# Patient Record
Sex: Male | Born: 1950
Health system: Southern US, Community
[De-identification: ages and names within clinical notes are randomized; demographics above are authoritative.]

## PROBLEM LIST (undated history)

## (undated) DIAGNOSIS — E7800 Pure hypercholesterolemia, unspecified: Secondary | ICD-10-CM

## (undated) DIAGNOSIS — F419 Anxiety disorder, unspecified: Secondary | ICD-10-CM

## (undated) DIAGNOSIS — E785 Hyperlipidemia, unspecified: Secondary | ICD-10-CM

## (undated) DIAGNOSIS — I10 Essential (primary) hypertension: Secondary | ICD-10-CM

## (undated) HISTORY — DX: Hyperlipidemia, unspecified: E78.5

## (undated) HISTORY — DX: Essential (primary) hypertension: I10

## (undated) HISTORY — DX: Pure hypercholesterolemia, unspecified: E78.00

---

## 1998-06-08 ENCOUNTER — Emergency Department (HOSPITAL_COMMUNITY): Admission: EM | Admit: 1998-06-08 | Discharge: 1998-06-08 | Payer: Self-pay | Admitting: Emergency Medicine

## 1998-06-08 ENCOUNTER — Encounter: Payer: Self-pay | Admitting: Emergency Medicine

## 2009-05-06 ENCOUNTER — Emergency Department (HOSPITAL_COMMUNITY): Admission: EM | Admit: 2009-05-06 | Discharge: 2009-05-07 | Payer: Self-pay | Admitting: Emergency Medicine

## 2010-09-14 ENCOUNTER — Ambulatory Visit: Payer: Self-pay | Admitting: Cardiology

## 2010-12-07 ENCOUNTER — Encounter (HOSPITAL_BASED_OUTPATIENT_CLINIC_OR_DEPARTMENT_OTHER)
Admission: RE | Admit: 2010-12-07 | Discharge: 2010-12-07 | Disposition: A | Discharge status: Home / Self Care | Payer: 59 | Source: Ambulatory Visit | Attending: Otolaryngology | Admitting: Otolaryngology

## 2010-12-07 ENCOUNTER — Other Ambulatory Visit (HOSPITAL_COMMUNITY): Payer: Self-pay

## 2010-12-07 DIAGNOSIS — Z01812 Encounter for preprocedural laboratory examination: Secondary | ICD-10-CM | POA: Insufficient documentation

## 2010-12-07 LAB — BASIC METABOLIC PANEL
BUN: 14 mg/dL (ref 6–23)
CO2: 31 mEq/L (ref 19–32)
Calcium: 10.6 mg/dL — ABNORMAL HIGH (ref 8.4–10.5)
Chloride: 102 mEq/L (ref 96–112)
Creatinine, Ser: 1.02 mg/dL (ref 0.4–1.5)
GFR calc Af Amer: >60 mL/min (ref >60)
GFR calc non Af Amer: >60 mL/min (ref >60)
Glucose, Bld: 126 mg/dL — ABNORMAL HIGH (ref 70–99)
Potassium: 4.6 mEq/L (ref 3.5–5.1)
Sodium: 138 mEq/L (ref 135–145)

## 2010-12-09 ENCOUNTER — Ambulatory Visit (HOSPITAL_COMMUNITY): Admission: RE | Admit: 2010-12-09 | Payer: 59 | Source: Ambulatory Visit | Admitting: Otolaryngology

## 2010-12-09 ENCOUNTER — Ambulatory Visit (HOSPITAL_BASED_OUTPATIENT_CLINIC_OR_DEPARTMENT_OTHER)
Admission: RE | Admit: 2010-12-09 | Discharge: 2010-12-09 | Disposition: A | Discharge status: Home / Self Care | Payer: 59 | Source: Ambulatory Visit | Attending: Otolaryngology | Admitting: Otolaryngology

## 2010-12-09 DIAGNOSIS — J339 Nasal polyp, unspecified: Secondary | ICD-10-CM | POA: Insufficient documentation

## 2010-12-09 DIAGNOSIS — Z01810 Encounter for preprocedural cardiovascular examination: Secondary | ICD-10-CM | POA: Insufficient documentation

## 2010-12-09 DIAGNOSIS — Z538 Procedure and treatment not carried out for other reasons: Secondary | ICD-10-CM | POA: Insufficient documentation

## 2010-12-20 ENCOUNTER — Encounter (HOSPITAL_BASED_OUTPATIENT_CLINIC_OR_DEPARTMENT_OTHER)
Admission: RE | Admit: 2010-12-20 | Discharge: 2010-12-20 | Disposition: A | Discharge status: Home / Self Care | Payer: 59 | Source: Ambulatory Visit | Attending: Otolaryngology | Admitting: Otolaryngology

## 2010-12-20 LAB — BASIC METABOLIC PANEL
BUN: 14 mg/dL (ref 6–23)
CO2: 29 mEq/L (ref 19–32)
Calcium: 10.2 mg/dL (ref 8.4–10.5)
Chloride: 105 mEq/L (ref 96–112)
Creatinine, Ser: 0.94 mg/dL (ref 0.4–1.5)
GFR calc Af Amer: >60 mL/min (ref >60)
GFR calc non Af Amer: >60 mL/min (ref >60)
Glucose, Bld: 126 mg/dL — ABNORMAL HIGH (ref 70–99)
Potassium: 4.7 mEq/L (ref 3.5–5.1)
Sodium: 139 mEq/L (ref 135–145)

## 2010-12-23 ENCOUNTER — Other Ambulatory Visit: Payer: Self-pay | Admitting: Otolaryngology

## 2010-12-23 ENCOUNTER — Ambulatory Visit (HOSPITAL_BASED_OUTPATIENT_CLINIC_OR_DEPARTMENT_OTHER)
Admission: RE | Admit: 2010-12-23 | Discharge: 2010-12-23 | Disposition: A | Discharge status: Home / Self Care | Payer: 59 | Source: Ambulatory Visit | Attending: Otolaryngology | Admitting: Otolaryngology

## 2010-12-23 DIAGNOSIS — J339 Nasal polyp, unspecified: Secondary | ICD-10-CM | POA: Insufficient documentation

## 2010-12-23 DIAGNOSIS — J343 Hypertrophy of nasal turbinates: Secondary | ICD-10-CM | POA: Insufficient documentation

## 2010-12-23 DIAGNOSIS — Z01812 Encounter for preprocedural laboratory examination: Secondary | ICD-10-CM | POA: Insufficient documentation

## 2010-12-23 DIAGNOSIS — J329 Chronic sinusitis, unspecified: Secondary | ICD-10-CM | POA: Insufficient documentation

## 2010-12-23 LAB — POCT HEMOGLOBIN-HEMACUE: Hemoglobin: 16.8 g/dL (ref 13.0–17.0)

## 2010-12-24 HISTORY — PX: NASAL POLYP SURGERY: SHX186

## 2011-01-03 ENCOUNTER — Other Ambulatory Visit: Payer: Self-pay | Admitting: Cardiology

## 2011-01-03 DIAGNOSIS — E7800 Pure hypercholesterolemia, unspecified: Secondary | ICD-10-CM

## 2011-01-04 NOTE — Telephone Encounter (Signed)
escribe request  

## 2011-01-10 NOTE — Op Note (Signed)
NAME:  Patrick Brooks, Patrick Brooks NO.:  [id number]  MEDICAL RECORD NO.:  [id number]           PATIENT TYPE:  LOCATION:                                 FACILITY:  PHYSICIAN:  Kinnie Scales. Annalee Genta, M.D.DATE OF BIRTH:  06/01/1951  DATE OF PROCEDURE:  12/23/2010 DATE OF DISCHARGE:                              OPERATIVE REPORT   LOCATION:  [facility].  PREOPERATIVE DIAGNOSES: 1. Nasal polyposis. 2. Chronic sinusitis. 3. Bilateral inferior turbinate hypertrophy.  POSTOPERATIVE DIAGNOSES: 1. Nasal polyposis. 2. Chronic sinusitis. 3. Bilateral inferior turbinate hypertrophy.  INDICATIONS FOR SURGERY: 1. Nasal polyposis. 2. Chronic sinusitis. 3. Bilateral inferior turbinate hypertrophy.  SURGICAL PROCEDURES: 1. Bilateral endoscopic sinus surgery with computer-assisted     navigation (Fusion) consisting of bilateral total ethmoidectomy,     bilateral maxillary antrostomy with removal of sinus disease, and     bilateral nasofrontal recess exploration. 2. Bilateral inferior turbinate reduction.  SURGEON:  Kinnie Scales. Annalee Genta, MD  ANESTHESIA:  General endotracheal.  COMPLICATIONS:  None.  ESTIMATED BLOOD LOSS:  Approximately 200 mL.  The patient was transferred from the operating room to the recovery room in stable condition.  BRIEF HISTORY:  The patient is a 60 year old white male who has been followed with a history of chronic and progressive bilateral nasal airway obstruction.  Examination several years ago in the office revealed massive nasal polyps with complete opacification of the sinuses.  He was treated with oral and topical steroids, oral antibiotics, and saline irrigation with some limited improvement in symptoms.  Over the last 2 years, he has noted progression in those symptoms with worsening nasal airway obstruction and poor response to medical therapy.  Reexamination and scanning again showed significant nasal polyposis  with near complete bilateral nasal airway obstruction and polypoid mucosal disease involving the maxillary ethmoid and frontal sinuses.  Given his history and findings, I recommended retreatment with aggressive medical therapy including oral steroid taper, topical nasal steroids, and antibiotics, followup CT scan with Fusion format showed continued polypoid disease, and given the patient's history, examination, and findings, I recommended that we undertake the above surgical procedures.  The risks, benefits, and possible complications of the procedures were discussed in detail with the patient and his family and they understood and concurred with our plan for surgery which is scheduled as an outpatient under general anesthesia on December 23, 2010, at [facility].  PROCEDURE:  The patient was brought to the operating room, placed in supine position on the operating table.  General endotracheal anesthesia was established without difficulty.  The patient was adequately anesthetized.  He was positioned on the operating table and prepped and draped in sterile fashion.  His nose was then injected with a total of 6 mL of 1% lidocaine 1:100,000 dilution epinephrine injected in submucosal fashion along the lateral nasal walls, inferior turbinates, intranasal polyps via a transoral sphenopalatine block.  The patient's nose was then packed with Afrin soaked cottonoid pledgets and left in place for approximately 10 minutes to allow for vasoconstriction and hemostasis. The Fusion navigation headgear was applied and  anatomic and surgical landmarks were identified and confirmed.  The device was used throughout the sinus component of the operation.  Beginning on the right-hand side, endoscopic sinus surgery was undertaken using a 0-degree nasal endoscope and a straight microdebrider.  Heavy polypoid disease was resected from within the nasal passageway and nasopharynx to the  level of the middle meatus and a total ethmoidectomy was then performed along the floor of the ethmoid sinus dissecting polypoid disease and bony septations from anterior to posterior.  The roof of the ethmoid sinus was then identified and a 45- degree telescope and curved microdebrider were used from posterior to anterior again removing polypoid disease within the ethmoid sinuses. The nasofrontal recess was identified.  This was completely occluded with polypoid disease and under hanging ethmoid infection, curved microdebrider was then used to open the nasofrontal recess widely. Within the right frontal sinus, there was minimal polypoid disease. Attention was then turned to the lateral nasal wall where the uncinate process was resected.  The patient had a large posterior accessory ostium and the intervening soft tissue was removed creating one large ostium.  Within the right maxillary sinus, polypoid disease was then resected with a curved microdebrider.  Left endoscopic sinus surgery was then undertaken.  Using a 0-degree telescope, polypoid disease was debrided from within the nasal cavity to the level of middle meatus.  Dissection was then carried from anterior to posterior along the floor of the ethmoid sinus and then from posterior to anterior along the roof of the ethmoid sinus using a 45- degree telescope and a curved microdebrider with the Fusion navigation instrumentation.  Nasofrontal recess was again identified and enlarged with removal of polypoid disease and underlying ethmoid bony septations and bony underhang.  Within the left frontal sinus, there was a large polyp which was resected with a curved microdebrider.  Attention was then turned to lateral nasal wall where again a large accessory ostium was identified.  The intervening soft tissue was resected.  Polyps within the left maxillary sinus were removed.  Attention was then turned to the inferior turbinates where  inferior turbinate reduction was undertaken.  Using bipolar cautery set at 12 watts, two submucosal passes were made in each inferior turbinate. Anterior incisions were created, overlying soft tissue elevated, and turbinate bone partially resected.  The turbinates were then outfractured creating more patent nasal cavity.  The patient's nasal cavity was then inspected and surgical debris cleared, irrigation undertaken, minimal bleeding.  The right middle turbinate was quite destabilized and tended to lateralize.  It was then resected using a curved endoscopic scissor.  Bleeding portion of the superior attachment as an anatomic landmark in removing the remainder of the middle turbinate, the posterior attachment was then cauterized with suction cautery.  A 50/50 mix of Bactroban and Kenalog 40 was then instilled in the frontal, maxillary, and ethmoid sinuses and bilateral Kennedy sinus packs were then placed in the common ethmoid cavities. The patient's oral cavity was irrigated and suctioned. Orogastric tube passed, stomach contents aspirated.  The patient was then awakened from his anesthetic, he was extubated and was transferred from the operating room to the recovery room in stable condition.  No complications.  ESTIMATED BLOOD LOSS:  Approximately 200 mL.          ______________________________ Kinnie Scales. Annalee Genta, M.D.     DLS/MEDQ  D:  04/54/0981  T:  12/24/2010  Job:  [id number]  Electronically Signed by Osborn Coho M.D. on 01/10/2011 10:23:46 AM

## 2011-04-11 ENCOUNTER — Encounter: Payer: Self-pay | Admitting: *Deleted

## 2011-04-13 ENCOUNTER — Other Ambulatory Visit: Payer: 59 | Admitting: *Deleted

## 2011-04-13 ENCOUNTER — Ambulatory Visit: Payer: 59 | Admitting: Cardiology

## 2011-04-22 ENCOUNTER — Other Ambulatory Visit: Payer: Self-pay | Admitting: *Deleted

## 2011-04-22 DIAGNOSIS — E7800 Pure hypercholesterolemia, unspecified: Secondary | ICD-10-CM

## 2011-05-06 ENCOUNTER — Other Ambulatory Visit (INDEPENDENT_AMBULATORY_CARE_PROVIDER_SITE_OTHER): Payer: 59 | Admitting: *Deleted

## 2011-05-06 ENCOUNTER — Telehealth: Payer: Self-pay | Admitting: *Deleted

## 2011-05-06 ENCOUNTER — Ambulatory Visit (INDEPENDENT_AMBULATORY_CARE_PROVIDER_SITE_OTHER): Payer: 59 | Admitting: Nurse Practitioner

## 2011-05-06 ENCOUNTER — Encounter: Payer: Self-pay | Admitting: Nurse Practitioner

## 2011-05-06 VITALS — BP 160/100 | HR 88 | Resp 20 | Ht 68.0 in | Wt 193.6 lb

## 2011-05-06 DIAGNOSIS — E785 Hyperlipidemia, unspecified: Secondary | ICD-10-CM | POA: Insufficient documentation

## 2011-05-06 DIAGNOSIS — E7800 Pure hypercholesterolemia, unspecified: Secondary | ICD-10-CM

## 2011-05-06 DIAGNOSIS — I10 Essential (primary) hypertension: Secondary | ICD-10-CM | POA: Insufficient documentation

## 2011-05-06 LAB — HEPATIC FUNCTION PANEL
ALT: 39 U/L (ref 0–53)
AST: 26 U/L (ref 0–37)
Albumin: 4.1 g/dL (ref 3.5–5.2)
Alkaline Phosphatase: 85 U/L (ref 39–117)
Bilirubin, Direct: 0.2 mg/dL (ref 0.0–0.3)
Total Bilirubin: 0.8 mg/dL (ref 0.3–1.2)
Total Protein: 6.5 g/dL (ref 6.0–8.3)

## 2011-05-06 LAB — LIPID PANEL
Cholesterol: 146 mg/dL (ref 0–200)
HDL: 31.6 mg/dL — ABNORMAL LOW (ref >39.00)
LDL Cholesterol: 88 mg/dL (ref 0–99)
Total CHOL/HDL Ratio: 5
Triglycerides: 132 mg/dL (ref 0.0–149.0)
VLDL: 26.4 mg/dL (ref 0.0–40.0)

## 2011-05-06 LAB — BASIC METABOLIC PANEL
BUN: 21 mg/dL (ref 6–23)
CO2: 30 mEq/L (ref 19–32)
Calcium: 10.1 mg/dL (ref 8.4–10.5)
Chloride: 105 mEq/L (ref 96–112)
Creatinine, Ser: 1.1 mg/dL (ref 0.4–1.5)
GFR: 75.01 mL/min (ref >60.00)
Glucose, Bld: 87 mg/dL (ref 70–99)
Potassium: 4.6 mEq/L (ref 3.5–5.1)
Sodium: 140 mEq/L (ref 135–145)

## 2011-05-06 NOTE — Telephone Encounter (Signed)
Message copied by Eugenia Pancoast on Fri May 06, 2011  3:59 PM ------      Message from: Rosalio Macadamia      Created: Fri May 06, 2011  3:28 PM       Ok to report. Labs are satisfactory.  Stay on same medicines. Exercise/weight loss. Recheck BMET/HPF/LIPIDS  in 6 months.

## 2011-05-06 NOTE — Patient Instructions (Addendum)
Continue with your current medicines. Monitor your blood pressure at home.  Record your readings and bring to your next visit. Limit sodium intake. Call for any problems.  We will call you labs next week

## 2011-05-06 NOTE — Telephone Encounter (Signed)
Mailed copy of labs 

## 2011-05-06 NOTE — Assessment & Plan Note (Signed)
Labs are checked today.  

## 2011-05-06 NOTE — Progress Notes (Signed)
    Patrick Brooks Date of Birth: 24-Sep-1950   History of Present Illness: Patrick Brooks is seen today for his 6 month check. He is seen for Patrick Brooks. He is doing well and has no complaint. He says his blood pressure at home has been ok but he has not been checking recently. No chest pain or shortness of breath. He is tolerating his medicines.  Current Outpatient Prescriptions on File Prior to Visit  Medication Sig Dispense Refill  . aspirin 81 MG tablet Take 81 mg by mouth as needed.        . fexofenadine (ALLEGRA) 180 MG tablet Take 180 mg by mouth as needed.        . lovastatin (MEVACOR) 20 MG tablet TAKE 1 TABLET BY MOUTH EVERY NIGHT AT BEDTIME  30 tablet  PRN  . valsartan-hydrochlorothiazide (DIOVAN-HCT) 160-12.5 MG per tablet Take 1 tablet by mouth daily.          No Known Allergies  Past Medical History  Diagnosis Date  . Hypertension   . Hyperlipidemia   . Hypercholesterolemia     Past Surgical History  Procedure Date  . Nasal polyp surgery 12/24/2010    History  Smoking status  . Never Smoker   Smokeless tobacco  . Never Used    History  Alcohol Use No    Family History  Problem Relation Age of Onset  . Alcohol abuse Father   . Alcohol abuse Brother     Review of Systems: The review of systems is as above.  All other systems were reviewed and are negative.  Physical Exam: BP 160/100  Pulse 88  Resp 20  Ht 5\' 8"  (1.727 m)  Wt 193 lb 9.6 oz (87.816 kg)  BMI 29.44 kg/m2 Patient is in no acute distress. Skin is warm and dry. Color is normal.  HEENT is unremarkable. Normocephalic/atraumatic. PERRL. Sclera are nonicteric. Neck is supple. No masses. No JVD. Lungs are clear. Cardiac exam shows a regular rate and rhythm. Abdomen is soft. Extremities are without edema. Gait and ROM are intact. No gross neurologic deficits noted.  LABORATORY DATA:   Assessment / Plan:

## 2011-05-06 NOTE — Assessment & Plan Note (Signed)
His blood pressure is up here today but he has not taken his medicines for today. I have encouraged him to get back to monitoring at home. We will see him back in 6 months. Patient is agreeable to this plan and will call if any problems develop in the interim.

## 2011-11-03 ENCOUNTER — Encounter: Payer: Self-pay | Admitting: Cardiology

## 2011-11-03 ENCOUNTER — Ambulatory Visit (INDEPENDENT_AMBULATORY_CARE_PROVIDER_SITE_OTHER): Payer: 59 | Admitting: Cardiology

## 2011-11-03 VITALS — BP 130/80 | HR 66 | Ht 68.0 in | Wt 191.0 lb

## 2011-11-03 DIAGNOSIS — E7800 Pure hypercholesterolemia, unspecified: Secondary | ICD-10-CM

## 2011-11-03 DIAGNOSIS — I10 Essential (primary) hypertension: Secondary | ICD-10-CM

## 2011-11-03 DIAGNOSIS — I119 Hypertensive heart disease without heart failure: Secondary | ICD-10-CM

## 2011-11-03 DIAGNOSIS — E785 Hyperlipidemia, unspecified: Secondary | ICD-10-CM

## 2011-11-03 LAB — HEPATIC FUNCTION PANEL
Albumin: 4.3 g/dL (ref 3.5–5.2)
Alkaline Phosphatase: 80 U/L (ref 39–117)
Total Protein: 6.9 g/dL (ref 6.0–8.3)

## 2011-11-03 LAB — LIPID PANEL
Cholesterol: 161 mg/dL (ref 0–200)
LDL Cholesterol: 99 mg/dL (ref 0–99)
Total CHOL/HDL Ratio: 4
Triglycerides: 123 mg/dL (ref 0.0–149.0)
VLDL: 24.6 mg/dL (ref 0.0–40.0)

## 2011-11-03 LAB — BASIC METABOLIC PANEL
CO2: 29 mEq/L (ref 19–32)
Chloride: 103 mEq/L (ref 96–112)
Potassium: 4 mEq/L (ref 3.5–5.1)
Sodium: 137 mEq/L (ref 135–145)

## 2011-11-03 NOTE — Progress Notes (Signed)
Quick Note:  Please report to patient. The recent labs are stable. Continue same medication and careful diet. ______ 

## 2011-11-03 NOTE — Patient Instructions (Signed)
Your physician recommends that you return for lab work in: today (liver, lipid, bmet) and in  6 months.  (lipid, liver, bmet)  Your physician wants you to follow-up in:   6 months.  You will receive a reminder letter in the mail two months in advance. If you don't receive a letter, please call our office to schedule the follow-up appointment.

## 2011-11-03 NOTE — Progress Notes (Signed)
Patrick Brooks Date of Birth:  1951-04-21 [facility] [address] [place], [place]  [address] [phone]  Fax   [phone]  HPI: This pleasant 61 year old gentleman is seen for a followup office visit.  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  He has been in good general health.  He does have a past history of elevated cholesterol and a history of essential hypertension.  Since last visit he has felt well with no new cardiac symptoms.  His previous shoulder injury symptoms have improved and he does do some range of motion exercises for his right shoulder.  Does have a history of some intermittent otitis externa and we refilled his prescription for generic Cortisporin otic suspension drops today.  Current Outpatient Prescriptions  Medication Sig Dispense Refill  . aspirin 81 MG tablet Take 81 mg by mouth as needed.        . lovastatin (MEVACOR) 20 MG tablet TAKE 1 TABLET BY MOUTH EVERY NIGHT AT BEDTIME  30 tablet  PRN  . valsartan-hydrochlorothiazide (DIOVAN-HCT) 160-12.5 MG per tablet Take 1 tablet by mouth daily.          No Known Allergies  Patient Active Problem List  Diagnoses  . HTN (hypertension)  . Hyperlipidemia    History  Smoking status  . Never Smoker   Smokeless tobacco  . Never Used    History  Alcohol Use No    Family History  Problem Relation Age of Onset  . Alcohol abuse Father   . Alcohol abuse Brother     Review of Systems: The patient denies any heat or cold intolerance.  No weight gain or weight loss.  The patient denies headaches or blurry vision.  There is no cough or sputum production.  The patient denies dizziness.  There is no hematuria or hematochezia.  The patient denies any muscle aches or arthritis.  The patient denies any rash.  The patient denies frequent falling or instability.  There is no history of depression or anxiety.  All other systems were  reviewed and are negative.   Physical Exam: Filed Vitals:   11/03/11 1127  BP: 130/80  Pulse: 66   the general examination reveals a well-developed well-nourished gentleman in no distress.The head and neck exam reveals pupils equal and reactive.  Extraocular movements are full.  There is no scleral icterus.  The mouth and pharynx are normal.  The neck is supple.  The carotids reveal no bruits.  The jugular venous pressure is normal.  The  thyroid is not enlarged.  There is no lymphadenopathy.  The chest is clear to percussion and auscultation.  There are no rales or rhonchi.  Expansion of the chest is symmetrical.  The precordium is quiet.  The first heart sound is normal.  The second heart sound is physiologically split.  There is no murmur gallop rub or click.  There is no abnormal lift or heave.  The abdomen is soft and nontender.  The bowel sounds are normal.  The liver and spleen are not enlarged.  There are no abdominal masses.  There are no abdominal bruits.  Extremities reveal good pedal pulses.  There is no phlebitis or edema.  There is no cyanosis or clubbing.  Strength is normal and symmetrical in all extremities.  There is no lateralizing weakness.  There are no sensory deficits.  The skin is warm and dry.  There is  no rash.      Assessment / Plan: Continue same medication.  Blood work drawn today.  Recheck in 6 months for followup office visit and fasting lab work.  Continue present diet and efforts at weight loss

## 2011-11-03 NOTE — Assessment & Plan Note (Signed)
The patient's blood pressure has been remaining stable on Diovan HCT and he is on the generic form now.  Is not having any chest pain or shortness of breath or palpitations.

## 2011-11-03 NOTE — Assessment & Plan Note (Signed)
The patient is on lovastatin 20 mg daily for his hypercholesterolemia.  We're checking blood work today.  He has not been having any side effects from the medication.

## 2011-11-07 ENCOUNTER — Telehealth: Payer: Self-pay | Admitting: *Deleted

## 2011-11-07 NOTE — Telephone Encounter (Signed)
Mailed copy of labs and left message to call if any questions  

## 2011-11-07 NOTE — Telephone Encounter (Signed)
Message copied by Burnell Blanks on Mon Nov 07, 2011  1:30 PM ------      Message from: Cassell Clement      Created: Thu Nov 03, 2011  6:51 PM       Please report to patient.  The recent labs are stable. Continue same medication and careful diet.

## 2012-01-02 ENCOUNTER — Other Ambulatory Visit: Payer: Self-pay | Admitting: Cardiology

## 2012-01-02 NOTE — Telephone Encounter (Signed)
Refilled valsartan/ hctz

## 2012-02-27 ENCOUNTER — Other Ambulatory Visit: Payer: Self-pay | Admitting: Cardiology

## 2012-02-27 NOTE — Telephone Encounter (Signed)
Refilled lovastatin 

## 2012-05-03 ENCOUNTER — Ambulatory Visit (INDEPENDENT_AMBULATORY_CARE_PROVIDER_SITE_OTHER): Payer: 59 | Admitting: Cardiology

## 2012-05-03 ENCOUNTER — Encounter: Payer: Self-pay | Admitting: Cardiology

## 2012-05-03 VITALS — BP 110/72 | HR 66 | Ht 68.0 in | Wt 189.0 lb

## 2012-05-03 DIAGNOSIS — E785 Hyperlipidemia, unspecified: Secondary | ICD-10-CM

## 2012-05-03 DIAGNOSIS — I119 Hypertensive heart disease without heart failure: Secondary | ICD-10-CM

## 2012-05-03 DIAGNOSIS — E7800 Pure hypercholesterolemia, unspecified: Secondary | ICD-10-CM

## 2012-05-03 DIAGNOSIS — H60399 Other infective otitis externa, unspecified ear: Secondary | ICD-10-CM

## 2012-05-03 DIAGNOSIS — H6090 Unspecified otitis externa, unspecified ear: Secondary | ICD-10-CM

## 2012-05-03 DIAGNOSIS — I10 Essential (primary) hypertension: Secondary | ICD-10-CM

## 2012-05-03 LAB — HEPATIC FUNCTION PANEL
ALT: 40 U/L (ref 0–53)
AST: 29 U/L (ref 0–37)
Albumin: 4.1 g/dL (ref 3.5–5.2)
Alkaline Phosphatase: 79 U/L (ref 39–117)
Total Protein: 6.8 g/dL (ref 6.0–8.3)

## 2012-05-03 LAB — BASIC METABOLIC PANEL
BUN: 14 mg/dL (ref 6–23)
CO2: 28 mEq/L (ref 19–32)
Chloride: 103 mEq/L (ref 96–112)
GFR: 76.41 mL/min (ref >60.00)
Glucose, Bld: 90 mg/dL (ref 70–99)
Potassium: 3.9 mEq/L (ref 3.5–5.1)
Sodium: 137 mEq/L (ref 135–145)

## 2012-05-03 LAB — LIPID PANEL
Cholesterol: 149 mg/dL (ref 0–200)
LDL Cholesterol: 83 mg/dL (ref 0–99)
Triglycerides: 149 mg/dL (ref 0.0–149.0)
VLDL: 29.8 mg/dL (ref 0.0–40.0)

## 2012-05-03 MED ORDER — NEOMYCIN-POLYMYXIN-HC 1 % OT SOLN: 4.0000 [drp] | Freq: Four times a day (QID) | OTIC | Status: DC | PRN

## 2012-05-03 NOTE — Progress Notes (Signed)
Quick Note:  Please report to patient. The recent labs are stable. Continue same medication and careful diet. ______ 

## 2012-05-03 NOTE — Assessment & Plan Note (Signed)
Patient is on Mevacor for his hypercholesterolemia.  We are checking fasting lab work today.  He denies any side effects from the statin therapy.

## 2012-05-03 NOTE — Progress Notes (Signed)
Patrick Brooks Date of Birth:  Apr 01, 1951 [facility] [address] [place], [place]  [address] [phone]  Fax   [phone]  HPI: This pleasant 61 year old gentleman is seen for a followup office visit. 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 He has been in good general health. He does have a past history of elevated cholesterol and a history of essential hypertension. Since last visit he has felt well with no new cardiac symptoms.  Since last visit the patient has had no new cardiac symptoms.  He does not get much aerobic exercise. He continues to have some problem with otitis externa of the left ear and we refilled his Cortisporin otic suspension 4 drops in the ear 3 times a day as necessary to do   Current Outpatient Prescriptions  Medication Sig Dispense Refill  . aspirin 81 MG tablet Take 81 mg by mouth as needed.        . lovastatin (MEVACOR) 20 MG tablet TAKE 1 TABLET BY MOUTH EVERY NIGHT AT BEDTIME  30 tablet  PRN  . valsartan-hydrochlorothiazide (DIOVAN-HCT) 160-12.5 MG per tablet TAKE 1 TABLET BY MOUTH DAILY  90 tablet  3  . NEOMYCIN-POLYMYXIN-HC, OTIC, (CORTISPORIN) 1 % SOLN Place 4 drops into both ears 4 (four) times daily as needed.  10 mL  0    No Known Allergies  Patient Active Problem List  Diagnosis  . HTN (hypertension)  . Hyperlipidemia    History  Smoking status  . Never Smoker   Smokeless tobacco  . Never Used    History  Alcohol Use No    Family History  Problem Relation Age of Onset  . Alcohol abuse Father   . Alcohol abuse Brother     Review of Systems: The patient denies any heat or cold intolerance.  No weight gain or weight loss.  The patient denies headaches or blurry vision.  There is no cough or sputum production.  The patient denies dizziness.  There is no hematuria or hematochezia.  The patient denies any muscle aches or arthritis.  The patient  denies any rash.  The patient denies frequent falling or instability.  There is no history of depression or anxiety.  All other systems were reviewed and are negative.   Physical Exam: Filed Vitals:   05/03/12 0952  BP: 110/72  Pulse: 66   The general appearance reveals a well-developed well-nourished gentleman in no distress.  His weight is down 2 pounds since last visit.The head and neck exam reveals pupils equal and reactive.  Extraocular movements are full.  There is no scleral icterus.  The mouth and pharynx are normal.  The neck is supple.  The carotids reveal no bruits.  The jugular venous pressure is normal.  The  thyroid is not enlarged.  There is no lymphadenopathy.  The chest is clear to percussion and auscultation.  There are no rales or rhonchi.  Expansion of the chest is symmetrical.  The precordium is quiet.  The first heart sound is normal.  The second heart sound is physiologically split.  There is no murmur gallop rub or click.  There is no abnormal lift or heave.  The abdomen is soft and nontender.  The bowel sounds are normal.  The liver and spleen are not enlarged.  There are no abdominal masses.  There are no abdominal bruits.  Extremities reveal good pedal pulses.  There is no phlebitis  or edema.  There is no cyanosis or clubbing.  Strength is normal and symmetrical in all extremities.  There is no lateralizing weakness.  There are no sensory deficits.  The skin is warm and dry.  There is no rash.     Assessment / Plan: Continue same medication.  I encouraged him to join a gym we'll get more regular aerobic exercise.  Recheck in 6 months for followup office visit EKG and fasting lab work.

## 2012-05-03 NOTE — Assessment & Plan Note (Signed)
No headaches or dizzy spells.  No symptoms of shortness of breath or chest pain

## 2012-05-03 NOTE — Patient Instructions (Addendum)
Will obtain labs today and call you with the results (lp/bmet/hfp)  Increase you aerobic exercise  Your physician wants you to follow-up in: 6 months with fasting labs (lp/bmet/hfp)  You will receive a reminder letter in the mail two months in advance. If you don't receive a letter, please call our office to schedule the follow-up appointment.   You need to try to get established with a primary care physician as soon as possible

## 2012-05-07 ENCOUNTER — Telehealth: Payer: Self-pay | Admitting: *Deleted

## 2012-05-07 NOTE — Telephone Encounter (Signed)
Message copied by Burnell Blanks on Mon May 07, 2012  5:24 PM ------      Message from: Cassell Clement      Created: Thu May 03, 2012  2:41 PM       Please report to patient.  The recent labs are stable. Continue same medication and careful diet.

## 2012-05-07 NOTE — Telephone Encounter (Signed)
Advised wife and mailed to patient

## 2013-02-12 ENCOUNTER — Other Ambulatory Visit: Payer: Self-pay | Admitting: *Deleted

## 2013-02-12 MED ORDER — VALSARTAN-HYDROCHLOROTHIAZIDE 160-12.5 MG PO TABS: 1.0000 | ORAL_TABLET | Freq: Every day | ORAL | Status: DC

## 2013-04-16 ENCOUNTER — Other Ambulatory Visit: Payer: Self-pay | Admitting: *Deleted

## 2013-04-16 MED ORDER — LOVASTATIN 20 MG PO TABS: 20.0000 mg | ORAL_TABLET | Freq: Every day | ORAL | Status: DC

## 2013-05-17 ENCOUNTER — Encounter: Payer: Self-pay | Admitting: Cardiology

## 2013-05-17 ENCOUNTER — Ambulatory Visit (INDEPENDENT_AMBULATORY_CARE_PROVIDER_SITE_OTHER): Payer: 59 | Admitting: Cardiology

## 2013-05-17 VITALS — BP 130/80 | HR 92 | Ht 68.0 in | Wt 189.0 lb

## 2013-05-17 DIAGNOSIS — E7800 Pure hypercholesterolemia, unspecified: Secondary | ICD-10-CM

## 2013-05-17 DIAGNOSIS — I10 Essential (primary) hypertension: Secondary | ICD-10-CM

## 2013-05-17 DIAGNOSIS — J209 Acute bronchitis, unspecified: Secondary | ICD-10-CM

## 2013-05-17 DIAGNOSIS — E785 Hyperlipidemia, unspecified: Secondary | ICD-10-CM

## 2013-05-17 DIAGNOSIS — I119 Hypertensive heart disease without heart failure: Secondary | ICD-10-CM

## 2013-05-17 DIAGNOSIS — J40 Bronchitis, not specified as acute or chronic: Secondary | ICD-10-CM

## 2013-05-17 LAB — HEPATIC FUNCTION PANEL
AST: 22 U/L (ref 0–37)
Alkaline Phosphatase: 85 U/L (ref 39–117)
Bilirubin, Direct: 0.1 mg/dL (ref 0.0–0.3)
Total Bilirubin: 0.9 mg/dL (ref 0.3–1.2)

## 2013-05-17 LAB — LIPID PANEL: Total CHOL/HDL Ratio: 4

## 2013-05-17 LAB — BASIC METABOLIC PANEL
BUN: 15 mg/dL (ref 6–23)
GFR: 76.99 mL/min (ref >60.00)
Potassium: 4.1 mEq/L (ref 3.5–5.1)

## 2013-05-17 MED ORDER — NEOMYCIN-POLYMYXIN-HC 1 % OT SOLN: 3.0000 [drp] | OTIC | Status: DC | PRN

## 2013-05-17 MED ORDER — VALSARTAN-HYDROCHLOROTHIAZIDE 160-12.5 MG PO TABS: 1.0000 | ORAL_TABLET | Freq: Every day | ORAL | Status: DC

## 2013-05-17 MED ORDER — AZITHROMYCIN 250 MG PO TABS: ORAL_TABLET | ORAL | Status: DC

## 2013-05-17 NOTE — Assessment & Plan Note (Signed)
The patient is not having any symptoms from his statin therapy.  No myalgias.  We are updating his lipid profile today

## 2013-05-17 NOTE — Assessment & Plan Note (Signed)
Or chest pain or shortness of breath.  No dizzy spells or syncope.  Blood pressure stable on current dose of Diovan HCT.  We are checking renal function today

## 2013-05-17 NOTE — Assessment & Plan Note (Signed)
Patient is not running fever but is coughing up yellow sputum and feels bad.  We will start him on Mucinex and Zithromax Z-Pak

## 2013-05-17 NOTE — Progress Notes (Signed)
Patrick Brooks Date of Birth:  [birth year] [facility] [address] [place], [place]  [address] [phone]         Fax   [phone]  History of Present Illness: This pleasant 62 year old gentleman is seen for a followup office visit. 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 He has been in good general health. He does have a past history of elevated cholesterol and a history of essential hypertension. Since last visit he has felt well with no new cardiac symptoms.  He has had a recent problem with pharyngitis and sinusitis and acute bronchitis.  He is not having any GI symptoms.  He has never had a colonoscopy and his PCP Dr. Kevan Ny has set him up for a screening colonoscopy with Dr. Danise Edge. Since last visit the patient has had no new cardiac symptoms. He does not get much aerobic exercise.  He continues to have some problem with otitis externa of the left ear and we refilled his Cortisporin otic suspension 4 drops in the ear 3 times a day as necessary to do   Current Outpatient Prescriptions  Medication Sig Dispense Refill  . aspirin 81 MG tablet Take 81 mg by mouth as needed.        . lovastatin (MEVACOR) 20 MG tablet Take 1 tablet (20 mg total) by mouth at bedtime.  30 tablet  PRN  . valsartan-hydrochlorothiazide (DIOVAN-HCT) 160-12.5 MG per tablet Take 1 tablet by mouth daily.  90 tablet  0  . azithromycin (ZITHROMAX) 250 MG tablet 2 today and then 1 daily until finshed  6 each  0  . NEOMYCIN-POLYMYXIN-HYDROCORTISONE (CORTISPORIN) 1 % SOLN otic solution Place 3 drops into both ears as needed.  10 mL  0   No current facility-administered medications for this visit.    No Known Allergies  Patient Active Problem List   Diagnosis Date Noted  . HTN (hypertension) 05/06/2011  . Hyperlipidemia 05/06/2011    History  Smoking status  . Never Smoker   Smokeless tobacco  . Never Used     History  Alcohol Use No    Family History  Problem Relation Age of Onset  . Alcohol abuse Father   . Alcohol abuse Brother     Review of Systems: Constitutional: no fever chills diaphoresis or fatigue or change in weight.  Head and neck: no hearing loss, no epistaxis, no photophobia or visual disturbance. Respiratory: No cough, shortness of breath or wheezing. Cardiovascular: No chest pain peripheral edema, palpitations. Gastrointestinal: No abdominal distention, no abdominal pain, no change in bowel habits hematochezia or melena. Genitourinary: No dysuria, no frequency, no urgency, no nocturia. Musculoskeletal:No arthralgias, no back pain, no gait disturbance or myalgias. Neurological: No dizziness, no headaches, no numbness, no seizures, no syncope, no weakness, no tremors. Hematologic: No lymphadenopathy, no easy bruising. Psychiatric: No confusion, no hallucinations, no sleep disturbance.    Physical Exam: Filed Vitals:   05/17/13 1144  BP: 130/80  Pulse: 92   the general appearance reveals a well-developed well-nourished gentleman in no distress.  His voice is hoarse and husky.The head and neck exam reveals pupils equal and reactive.  Extraocular movements are full.  There is no scleral icterus.  The mouth and pharynx are normal.  The neck is supple.  The carotids reveal no bruits.  The jugular venous pressure is normal.  The  thyroid is not enlarged.  There is no lymphadenopathy.  The chest is clear to percussion and auscultation.  There are no rales or rhonchi.  Expansion of the chest is symmetrical.  The precordium is quiet.  The first heart sound is normal.  The second heart sound is physiologically split.  There is no murmur gallop rub or click.  There is no abnormal lift or heave.  The abdomen is soft and nontender.  The bowel sounds are normal.  The liver and spleen are not enlarged.  There are no abdominal masses.  There are no abdominal bruits.  Extremities reveal  good pedal pulses.  There is no phlebitis or edema.  There is no cyanosis or clubbing.  Strength is normal and symmetrical in all extremities.  There is no lateralizing weakness.  There are no sensory deficits.  The skin is warm and dry.  There is no rash.  EKG shows normal sinus rhythm and is within normal limits   Assessment / Plan: Continue same medication.  Lab work today pending.  Return in one year for followup office visit lipid panel hepatic function panel and basal metabolic panel. Treat his acute bronchitis/sinusitis with Z-Pak and Mucinex

## 2013-05-17 NOTE — Patient Instructions (Addendum)
Will obtain labs today and call you with the results (lp/bmet/hfp)  RX FOR ZPAK AND CORTISPORIN EAR DROPS HAVE BEEN SENT TO PHARMACY  NEED TO GET MUCINEX PLAIN AND USE AS NEEDED  Your physician wants you to follow-up in: 1 YEAR OV/LP/BM/ET/HFP You will receive a reminder letter in the mail two months in advance. If you don't receive a letter, please call our office to schedule the follow-up appointment.

## 2013-05-17 NOTE — Progress Notes (Signed)
Quick Note:  Please report to patient. The recent labs are stable. Continue same medication and careful diet. Blood sugar 112 slightly high. Watch carbohydrates. Lose weight. ______

## 2013-05-20 ENCOUNTER — Telehealth: Payer: Self-pay | Admitting: *Deleted

## 2013-05-20 NOTE — Telephone Encounter (Signed)
Advised wife and mailed copy

## 2013-05-20 NOTE — Telephone Encounter (Signed)
Message copied by Burnell Blanks on Mon May 20, 2013  8:47 AM ------      Message from: Cassell Clement      Created: Fri May 17, 2013  7:36 PM       Please report to patient.  The recent labs are stable. Continue same medication and careful diet.  Blood sugar 112 slightly high.  Watch carbohydrates.  Lose weight. ------

## 2013-05-24 ENCOUNTER — Encounter: Payer: Self-pay | Admitting: Cardiology

## 2013-05-27 ENCOUNTER — Encounter: Payer: Self-pay | Admitting: Cardiovascular Disease

## 2013-05-28 ENCOUNTER — Emergency Department (HOSPITAL_COMMUNITY)
Admission: EM | Admit: 2013-05-28 | Discharge: 2013-05-28 | Disposition: A | Discharge status: Home / Self Care | Payer: PRIVATE HEALTH INSURANCE | Attending: Emergency Medicine | Admitting: Emergency Medicine

## 2013-05-28 ENCOUNTER — Encounter (HOSPITAL_COMMUNITY): Payer: Self-pay | Admitting: *Deleted

## 2013-05-28 DIAGNOSIS — Z79899 Other long term (current) drug therapy: Secondary | ICD-10-CM | POA: Insufficient documentation

## 2013-05-28 DIAGNOSIS — E785 Hyperlipidemia, unspecified: Secondary | ICD-10-CM | POA: Insufficient documentation

## 2013-05-28 DIAGNOSIS — I10 Essential (primary) hypertension: Secondary | ICD-10-CM | POA: Insufficient documentation

## 2013-05-28 DIAGNOSIS — E7800 Pure hypercholesterolemia, unspecified: Secondary | ICD-10-CM | POA: Insufficient documentation

## 2013-05-28 DIAGNOSIS — S0003XA Contusion of scalp, initial encounter: Secondary | ICD-10-CM | POA: Insufficient documentation

## 2013-05-28 DIAGNOSIS — S0083XA Contusion of other part of head, initial encounter: Secondary | ICD-10-CM

## 2013-05-28 DIAGNOSIS — S0993XA Unspecified injury of face, initial encounter: Secondary | ICD-10-CM | POA: Insufficient documentation

## 2013-05-28 MED ORDER — IBUPROFEN 200 MG PO TABS: 600.0000 mg | ORAL_TABLET | Freq: Once | ORAL | Status: DC
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
## 2013-05-28 NOTE — ED Notes (Signed)
Patient is alert and oriented x3.  He was given DC instructions and follow up visit instructions.  Patient gave verbal understanding.  He was DC ambulatory under his own power to home.  V/S stable.  He was not showing any signs of distress on DC 

## 2013-05-29 NOTE — ED Provider Notes (Signed)
Medical screening examination/treatment/procedure(s) were performed by non-physician practitioner and as supervising physician I was immediately available for consultation/collaboration.   Junius Argyle, MD 05/29/13 1227

## 2013-07-11 ENCOUNTER — Other Ambulatory Visit: Payer: Self-pay | Admitting: Gastroenterology

## 2013-08-22 ENCOUNTER — Other Ambulatory Visit: Payer: Self-pay | Admitting: Cardiology

## 2014-03-26 ENCOUNTER — Telehealth: Payer: Self-pay | Admitting: Cardiology

## 2014-03-26 ENCOUNTER — Emergency Department (HOSPITAL_COMMUNITY)
Admission: EM | Admit: 2014-03-26 | Discharge: 2014-03-26 | Disposition: A | Discharge status: Home / Self Care | Payer: 59 | Attending: Emergency Medicine | Admitting: Emergency Medicine

## 2014-03-26 ENCOUNTER — Emergency Department (HOSPITAL_COMMUNITY): Payer: 59

## 2014-03-26 ENCOUNTER — Encounter (HOSPITAL_COMMUNITY): Payer: Self-pay | Admitting: Emergency Medicine

## 2014-03-26 DIAGNOSIS — F3289 Other specified depressive episodes: Secondary | ICD-10-CM | POA: Insufficient documentation

## 2014-03-26 DIAGNOSIS — I10 Essential (primary) hypertension: Secondary | ICD-10-CM | POA: Insufficient documentation

## 2014-03-26 DIAGNOSIS — Z79899 Other long term (current) drug therapy: Secondary | ICD-10-CM | POA: Insufficient documentation

## 2014-03-26 DIAGNOSIS — R079 Chest pain, unspecified: Secondary | ICD-10-CM | POA: Insufficient documentation

## 2014-03-26 DIAGNOSIS — F419 Anxiety disorder, unspecified: Secondary | ICD-10-CM

## 2014-03-26 DIAGNOSIS — F411 Generalized anxiety disorder: Secondary | ICD-10-CM | POA: Insufficient documentation

## 2014-03-26 DIAGNOSIS — F32A Depression, unspecified: Secondary | ICD-10-CM

## 2014-03-26 DIAGNOSIS — E785 Hyperlipidemia, unspecified: Secondary | ICD-10-CM | POA: Insufficient documentation

## 2014-03-26 DIAGNOSIS — F329 Major depressive disorder, single episode, unspecified: Secondary | ICD-10-CM | POA: Insufficient documentation

## 2014-03-26 DIAGNOSIS — E7800 Pure hypercholesterolemia, unspecified: Secondary | ICD-10-CM | POA: Insufficient documentation

## 2014-03-26 LAB — COMPREHENSIVE METABOLIC PANEL
ALBUMIN: 4.3 g/dL (ref 3.5–5.2)
ALK PHOS: 81 U/L (ref 39–117)
ALT: 52 U/L (ref 0–53)
AST: 40 U/L — AB (ref 0–37)
Anion gap: 16 — ABNORMAL HIGH (ref 5–15)
BUN: 12 mg/dL (ref 6–23)
CALCIUM: 10.1 mg/dL (ref 8.4–10.5)
CO2: 22 mEq/L (ref 19–32)
Chloride: 99 mEq/L (ref 96–112)
Creatinine, Ser: 0.95 mg/dL (ref 0.50–1.35)
GFR calc non Af Amer: 87 mL/min — ABNORMAL LOW (ref >90)
GLUCOSE: 118 mg/dL — AB (ref 70–99)
POTASSIUM: 4.1 meq/L (ref 3.7–5.3)
Sodium: 137 mEq/L (ref 137–147)
Total Bilirubin: 0.7 mg/dL (ref 0.3–1.2)
Total Protein: 7.2 g/dL (ref 6.0–8.3)

## 2014-03-26 LAB — CBC WITH DIFFERENTIAL/PLATELET
BASOS PCT: 1 % (ref 0–1)
Basophils Absolute: 0 10*3/uL (ref 0.0–0.1)
Eosinophils Absolute: 0.1 10*3/uL (ref 0.0–0.7)
Eosinophils Relative: 3 % (ref 0–5)
HCT: 44.5 % (ref 39.0–52.0)
HEMOGLOBIN: 16.1 g/dL (ref 13.0–17.0)
Lymphocytes Relative: 31 % (ref 12–46)
Lymphs Abs: 1.6 10*3/uL (ref 0.7–4.0)
MCH: 31.9 pg (ref 26.0–34.0)
MCHC: 36.2 g/dL — AB (ref 30.0–36.0)
MCV: 88.3 fL (ref 78.0–100.0)
MONO ABS: 0.3 10*3/uL (ref 0.1–1.0)
MONOS PCT: 6 % (ref 3–12)
NEUTROS PCT: 59 % (ref 43–77)
Neutro Abs: 3 10*3/uL (ref 1.7–7.7)
Platelets: 191 10*3/uL (ref 150–400)
RBC: 5.04 MIL/uL (ref 4.22–5.81)
RDW: 13.4 % (ref 11.5–15.5)
WBC: 5 10*3/uL (ref 4.0–10.5)

## 2014-03-26 LAB — I-STAT TROPONIN, ED: TROPONIN I, POC: 0 ng/mL (ref 0.00–0.08)

## 2014-03-26 LAB — CBG MONITORING, ED: Glucose-Capillary: 114 mg/dL — ABNORMAL HIGH (ref 70–99)

## 2014-03-26 MED ORDER — LORAZEPAM 1 MG PO TABS: 1.0000 mg | ORAL_TABLET | Freq: Two times a day (BID) | ORAL | Status: AC | PRN

## 2014-03-26 MED ORDER — SERTRALINE HCL 25 MG PO TABS: ORAL_TABLET | ORAL | Status: DC

## 2014-03-26 NOTE — Discharge Instructions (Signed)
Thanks for all that your do at [facility]!  Depression, Adult Depression is feeling sad, low, down in the dumps, blue, gloomy, or empty. In general, there are two kinds of depression:  Normal sadness or grief. This can happen after something upsetting. It often goes away on its own within 2 weeks. After losing a loved one (bereavement), normal sadness and grief may last longer than two weeks. It usually gets better with time.  Clinical depression. This kind lasts longer than normal sadness or grief. It keeps you from doing the things you normally do in life. It is often hard to function at home, work, or at school. It may affect your relationships with others. Treatment is often needed. GET HELP RIGHT AWAY IF:  You have thoughts about hurting yourself or others.  You lose touch with reality (psychotic symptoms). You may:  See or hear things that are not real.  Have untrue beliefs about your life or people around you.  Your medicine is giving you problems. MAKE SURE YOU:  Understand these instructions.  Will watch your condition.  Will get help right away if you are not doing well or get worse. Document Released: 10/01/2010 Document Revised: 05/23/2012 Document Reviewed: 12/29/2011 [facility] Patient Information 2015 [place], [place]. This information is not intended to replace advice given to you by your health care provider. Make sure you discuss any questions you have with your health care provider.  Panic Attacks Panic attacks are sudden, short-livedsurges of severe anxiety, fear, or discomfort. They may occur for no reason when you are relaxed, when you are anxious, or when you are sleeping. Panic attacks may occur for a number of reasons:   Healthy people occasionally have panic attacks in extreme, life-threatening situations, such as war or natural disasters. Normal anxiety is a protective mechanism of the body that helps [place] react to danger (fight or flight  response).  Panic attacks are often seen with anxiety disorders, such as panic disorder, social anxiety disorder, generalized anxiety disorder, and phobias. Anxiety disorders cause excessive or uncontrollable anxiety. They may interfere with your relationships or other life activities.  Panic attacks are sometimes seen with other mental illnesses, such as depression and posttraumatic stress disorder.  Certain medical conditions, prescription medicines, and drugs of abuse can cause panic attacks. SYMPTOMS  Panic attacks start suddenly, peak within 20 minutes, and are accompanied by four or more of the following symptoms:  Pounding heart or fast heart rate (palpitations).  Sweating.  Trembling or shaking.  Shortness of breath or feeling smothered.  Feeling choked.  Chest pain or discomfort.  Nausea or strange feeling in your stomach.  Dizziness, light-headedness, or feeling like you will faint.  Chills or hot flushes.  Numbness or tingling in your lips or hands and feet.  Feeling that things are not real or feeling that you are not yourself.  Fear of losing control or going crazy.  Fear of dying. Some of these symptoms can mimic serious medical conditions. For example, you may think you are having a heart attack. Although panic attacks can be very scary, they are not life threatening. DIAGNOSIS  Panic attacks are diagnosed through an assessment by your health care provider. Your health care provider will ask questions about your symptoms, such as where and when they occurred. Your health care provider will also ask about your medical history and use of alcohol and drugs, including prescription medicines. Your health care provider may order blood tests or other studies to rule out  a serious medical condition. Your health care provider may refer you to a mental health professional for further evaluation. TREATMENT   Most healthy people who have one or two panic attacks in an  extreme, life-threatening situation will not require treatment.  The treatment for panic attacks associated with anxiety disorders or other mental illness typically involves counseling with a mental health professional, medicine, or a combination of both. Your health care provider will help determine what treatment is best for you.  Panic attacks due to physical illness usually go away with treatment of the illness. If prescription medicine is causing panic attacks, talk with your health care provider about stopping the medicine, decreasing the dose, or substituting another medicine.  Panic attacks due to alcohol or drug abuse go away with abstinence. Some adults need professional help in order to stop drinking or using drugs. HOME CARE INSTRUCTIONS   Take all medicines as directed by your health care provider.   Schedule and attend follow-up visits as directed by your health care provider. It is important to keep all your appointments. SEEK MEDICAL CARE IF:  You are not able to take your medicines as prescribed.  Your symptoms do not improve or get worse. SEEK IMMEDIATE MEDICAL CARE IF:   You experience panic attack symptoms that are different than your usual symptoms.  You have serious thoughts about hurting yourself or others.  You are taking medicine for panic attacks and have a serious side effect. MAKE SURE YOU:  Understand these instructions.  Will watch your condition.  Will get help right away if you are not doing well or get worse. Document Released: 08/29/2005 Document Revised: 09/03/2013 Document Reviewed: 04/12/2013 [facility] Patient Information 2015 [place], [place]. This information is not intended to replace advice given to you by your health care provider. Make sure you discuss any questions you have with your health care provider.

## 2014-03-26 NOTE — Telephone Encounter (Signed)
Wife states pt has not felt good for the past couple of days and has been having some vague chest discomfort. She has been trying to talk him in to going to MD to be checked out.  Now he is having dizziness and feeling discomfort in his chest area. I advised wife that pt should report to [facility] ED for further evaluation. She verbalized understanding.

## 2014-03-26 NOTE — ED Provider Notes (Signed)
Patient evaluation discussed with Dr. Silvio Clayman. I concur with his evaluation. I've spoken with the patient and examined him. I reviewed his history. Examine him. He works as an Therapist, sports at [employer]. He, himself, and his wife Marijo File agreement that he is going to an episode of depression with some intermittent anxiety. We'll start him on Zoloft. He is familiar, and comfortable this medication and we'll start with low dose and when necessary Ativan. Encourage primary care followup. I think his chest tightness is related to his anxiety. He is a low cardiac score of 2. I have asked him to followup with Dr. Asa Saunas, the cardiologist that he sees for his hypertension.  Tanna Furry, MD 03/26/14 [phone]

## 2014-03-26 NOTE — ED Provider Notes (Signed)
CSN: [id number]     Arrival date & time 03/26/14  1237 History   First MD Initiated Contact with Patient 03/26/14 1500     Chief Complaint  Patient presents with  . Dizziness     (Consider location/radiation/quality/duration/timing/severity/associated sxs/prior Treatment) Patient is a 63 y.o. male presenting with chest pain.  Chest Pain Pain location:  Substernal area Pain quality: tightness   Pain radiates to:  Does not radiate Pain radiates to the back: no   Pain severity:  Mild Onset quality:  Gradual Progression:  Waxing and waning Chronicity:  New Context: stress   Relieved by:  Nothing Associated symptoms: dizziness   Associated symptoms: no abdominal pain, no back pain, no cough, no fatigue, no fever, no headache, no nausea, no near-syncope, no numbness, no shortness of breath and not vomiting   Risk factors: high cholesterol, hypertension and male sex   Risk factors: no aortic disease, no coronary artery disease, no diabetes mellitus and no smoking     Past Medical History  Diagnosis Date  . Hypertension   . Hyperlipidemia   . Hypercholesterolemia    Past Surgical History  Procedure Laterality Date  . Nasal polyp surgery  12/24/2010   Family History  Problem Relation Age of Onset  . Alcohol abuse Father   . Alcohol abuse Brother    History  Substance Use Topics  . Smoking status: Never Smoker   . Smokeless tobacco: Never Used  . Alcohol Use: No    Review of Systems  Constitutional: Negative for fever, chills and fatigue.  HENT: Negative for sore throat.   Eyes: Negative for pain.  Respiratory: Negative for cough and shortness of breath.   Cardiovascular: Positive for chest pain. Negative for near-syncope.  Gastrointestinal: Negative for nausea, vomiting and abdominal pain.  Genitourinary: Negative for dysuria and flank pain.  Musculoskeletal: Negative for back pain and neck pain.  Skin: Negative for rash.  Neurological: Positive for dizziness.  Negative for seizures, numbness and headaches.      Allergies  Review of patient's allergies indicates no known allergies.  Home Medications   Prior to Admission medications   Medication Sig Start Date End Date Taking? Authorizing Provider  lovastatin (MEVACOR) 20 MG tablet Take 1 tablet (20 mg total) by mouth at bedtime. 04/16/13  Yes Darlin Coco, MD  valsartan-hydrochlorothiazide (DIOVAN-HCT) 160-12.5 MG per tablet Take 1 tablet by mouth daily.   Yes Historical Provider, MD  LORazepam (ATIVAN) 1 MG tablet Take 1 tablet (1 mg total) by mouth 2 (two) times daily as needed for anxiety. 03/26/14   Tanna Furry, MD  sertraline (ZOLOFT) 25 MG tablet 1/2 tab (12.5mg ) qam for 7 days, then increase to 1 tab (25 mg) 03/26/14   Tanna Furry, MD   BP 138/80  Pulse 66  Temp(Src) 97.6 F (36.4 C) (Oral)  Resp 11  Ht 5\' 8"  (1.727 m)  Wt 190 lb (86.183 kg)  BMI 28.90 kg/m2  SpO2 18% Physical Exam  Constitutional: He is oriented to person, place, and time. He appears well-developed and well-nourished. No distress.  HENT:  Head: Normocephalic and atraumatic.  Eyes: Pupils are equal, round, and reactive to light.  Neck: Normal range of motion.  Cardiovascular: Normal rate and regular rhythm.   Pulmonary/Chest: Effort normal and breath sounds normal. No respiratory distress. He exhibits no bony tenderness.  Abdominal: Soft. He exhibits no distension. There is no tenderness.  Musculoskeletal: Normal range of motion.       Cervical back: He exhibits  no bony tenderness and no deformity.       Thoracic back: He exhibits no bony tenderness and no deformity.       Lumbar back: He exhibits no bony tenderness and no deformity.  Neurological: He is alert and oriented to person, place, and time.  Skin: Skin is warm. He is not diaphoretic.    ED Course  Procedures (including critical care time) Labs Review Labs Reviewed  COMPREHENSIVE METABOLIC PANEL - Abnormal; Notable for the following:    Glucose,  Bld 118 (*)    AST 40 (*)    GFR calc non Af Amer 87 (*)    Anion gap 16 (*)    All other components within normal limits  CBC WITH DIFFERENTIAL - Abnormal; Notable for the following:    MCHC 36.2 (*)    All other components within normal limits  CBG MONITORING, ED - Abnormal; Notable for the following:    Glucose-Capillary 114 (*)    All other components within normal limits  I-STAT TROPOININ, ED    Imaging Review Dg Chest 2 View  03/26/2014   CLINICAL DATA:  Chest pressure, lightheadedness  EXAM: CHEST  2 VIEW  COMPARISON:  None.  FINDINGS: The heart size and mediastinal contours are within normal limits. Both lungs are clear. The visualized skeletal structures are unremarkable.  IMPRESSION: No active cardiopulmonary disease.   Electronically Signed   By: Skipper Cliche M.D.   On: 03/26/2014 14:02     EKG Interpretation   Date/Time:  Wednesday March 26 2014 12:40:51 EDT Ventricular Rate:  96 PR Interval:  182 QRS Duration: 82 QT Interval:  332 QTC Calculation: 419 R Axis:   64 Text Interpretation:  Normal sinus rhythm Low voltage QRS Borderline ECG  Confirmed by Jeneen Rinks  MD, [place] ([id number]) on 03/26/2014 3:27:44 PM      MDM   Final diagnoses:  Depression  Anxiety   63 year old male with a history of hypertension hyperlipidemia presents today with several day history of chest tightness without any associated nausea, vomiting, diaphoresis with associated mild dizziness started this morning.  At this time patient states he is not having any chest pain. He is hemodynamically stable and appears in no acute distress. Patient has minimal risk factors and given such he has a HEART score of 2 (H-0, E-0, A-1, R-1, T-0). Troponin was negative.  Attending of the significant amount time at the bedside discussing the patient's symptoms. Patient admits to feeling stressed and depressed lately, and feels that this may be because of his recent chest pain and decreased energy. He states he  feels that he may have had a panic attack earlier this morning that may be the cause of his chest pain. At this time, she will be discharged with antidepressants and followup with his primary care physician. 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 Will recommend followup with his primary care physician. Strict return precautions given. Patient seen and evaluated by myself and my attending, Dr. Jeneen Rinks.      Freddi Che, MD 03/27/14 [phone]

## 2014-03-26 NOTE — Telephone Encounter (Signed)
New problem   Pt having chest tightness or pain and dizziness.

## 2014-03-26 NOTE — ED Notes (Signed)
Dr. Christo at bedside  

## 2014-03-26 NOTE — ED Notes (Signed)
PT reports feeling dizzy, shaky, tightness in chest. Denies N,V, diaphoresis. Not diabetic. Pain started Friday night and dizziness started this morning. HTN hx.

## 2014-04-05 NOTE — ED Provider Notes (Signed)
I saw and evaluated the patient, reviewed the resident's note and I agree with the findings and plan.   EKG Interpretation   Date/Time:  Wednesday March 26 2014 12:40:51 EDT Ventricular Rate:  96 PR Interval:  182 QRS Duration: 82 QT Interval:  332 QTC Calculation: 419 R Axis:   64 Text Interpretation:  Normal sinus rhythm Low voltage QRS Borderline ECG  Confirmed by Jeneen Rinks  MD, [place] ([id number]) on 03/26/2014 3:27:44 PM        Tanna Furry, MD 04/05/14 [phone]

## 2014-04-23 ENCOUNTER — Other Ambulatory Visit: Payer: Self-pay | Admitting: *Deleted

## 2014-04-23 ENCOUNTER — Telehealth: Payer: Self-pay | Admitting: *Deleted

## 2014-04-23 NOTE — Telephone Encounter (Signed)
Spoke with wife and patient is taking zoloft 75 mg daily, wanted chart updated. Changed on medication list

## 2014-06-09 ENCOUNTER — Other Ambulatory Visit: Payer: Self-pay | Admitting: Gastroenterology

## 2014-06-09 ENCOUNTER — Other Ambulatory Visit: Payer: Self-pay | Admitting: Cardiology

## 2014-06-10 ENCOUNTER — Other Ambulatory Visit: Payer: Self-pay | Admitting: Cardiology

## 2014-06-12 ENCOUNTER — Telehealth: Payer: Self-pay | Admitting: Cardiology

## 2014-06-12 ENCOUNTER — Other Ambulatory Visit: Payer: Self-pay | Admitting: *Deleted

## 2014-06-12 MED ORDER — VALSARTAN-HYDROCHLOROTHIAZIDE 160-12.5 MG PO TABS: 1.0000 | ORAL_TABLET | Freq: Every day | ORAL | Status: DC

## 2014-06-12 MED ORDER — VALSARTAN-HYDROCHLOROTHIAZIDE 160-12.5 MG PO TABS
1.0000 | ORAL_TABLET | Freq: Every day | ORAL | Status: DC
Start: 2014-06-12 — End: 2014-08-19

## 2014-06-12 NOTE — Telephone Encounter (Signed)
No answer

## 2014-06-12 NOTE — Telephone Encounter (Signed)
Refilled and scheduled follow up appointment

## 2014-06-12 NOTE — Telephone Encounter (Signed)
New message     [facility]I [facility] has faxed twice a refill for his valsartin but they have not heard from [place].  Is he still supposed to be on this medication?

## 2014-07-14 ENCOUNTER — Other Ambulatory Visit: Payer: Self-pay | Admitting: Cardiology

## 2014-07-23 ENCOUNTER — Ambulatory Visit: Payer: Self-pay | Admitting: Cardiology

## 2014-08-04 ENCOUNTER — Encounter (HOSPITAL_COMMUNITY): Payer: Self-pay | Admitting: *Deleted

## 2014-08-18 ENCOUNTER — Encounter (HOSPITAL_COMMUNITY): Payer: Self-pay | Admitting: Gastroenterology

## 2014-08-18 ENCOUNTER — Encounter (HOSPITAL_COMMUNITY)
Admission: RE | Disposition: A | Discharge status: Home / Self Care | Payer: Self-pay | Source: Ambulatory Visit | Attending: Gastroenterology

## 2014-08-18 ENCOUNTER — Ambulatory Visit (HOSPITAL_COMMUNITY)
Admission: RE | Admit: 2014-08-18 | Discharge: 2014-08-18 | Disposition: A | Discharge status: Home / Self Care | Payer: 59 | Source: Ambulatory Visit | Attending: Gastroenterology | Admitting: Gastroenterology

## 2014-08-18 ENCOUNTER — Ambulatory Visit (HOSPITAL_COMMUNITY): Payer: 59 | Admitting: Certified Registered Nurse Anesthetist

## 2014-08-18 DIAGNOSIS — E785 Hyperlipidemia, unspecified: Secondary | ICD-10-CM | POA: Insufficient documentation

## 2014-08-18 DIAGNOSIS — Z7982 Long term (current) use of aspirin: Secondary | ICD-10-CM | POA: Diagnosis not present

## 2014-08-18 DIAGNOSIS — F419 Anxiety disorder, unspecified: Secondary | ICD-10-CM | POA: Diagnosis not present

## 2014-08-18 DIAGNOSIS — K635 Polyp of colon: Secondary | ICD-10-CM | POA: Diagnosis not present

## 2014-08-18 DIAGNOSIS — I10 Essential (primary) hypertension: Secondary | ICD-10-CM | POA: Insufficient documentation

## 2014-08-18 DIAGNOSIS — E780 Pure hypercholesterolemia: Secondary | ICD-10-CM | POA: Diagnosis not present

## 2014-08-18 DIAGNOSIS — Z1211 Encounter for screening for malignant neoplasm of colon: Secondary | ICD-10-CM | POA: Diagnosis present

## 2014-08-18 HISTORY — DX: Anxiety disorder, unspecified: F41.9

## 2014-08-18 HISTORY — PX: COLONOSCOPY WITH PROPOFOL: SHX5780

## 2014-08-18 SURGERY — COLONOSCOPY WITH PROPOFOL
Anesthesia: Monitor Anesthesia Care

## 2014-08-18 MED ORDER — PROPOFOL INFUSION 10 MG/ML OPTIME
INTRAVENOUS | Status: DC | PRN
  Administered 2014-08-18: 140 ug/kg/min via INTRAVENOUS

## 2014-08-18 MED ORDER — ONDANSETRON HCL 4 MG/2ML IJ SOLN
INTRAMUSCULAR | Status: AC
  Filled 2014-08-18: qty 2

## 2014-08-18 MED ORDER — PROPOFOL 10 MG/ML IV BOLUS
INTRAVENOUS | Status: AC
  Filled 2014-08-18: qty 20

## 2014-08-18 MED ORDER — LACTATED RINGERS IV SOLN
INTRAVENOUS | Status: DC | PRN
  Administered 2014-08-18: 11:00:00 via INTRAVENOUS

## 2014-08-18 MED ORDER — PROPOFOL 10 MG/ML IV EMUL
INTRAVENOUS | Status: DC | PRN
  Administered 2014-08-18: 20 mg via INTRAVENOUS
  Administered 2014-08-18: 50 mg via INTRAVENOUS
  Administered 2014-08-18: 20 mg via INTRAVENOUS

## 2014-08-18 MED ORDER — LACTATED RINGERS IV SOLN
INTRAVENOUS | Status: DC
  Administered 2014-08-18: 1000 mL via INTRAVENOUS

## 2014-08-18 SURGICAL SUPPLY — 22 items

## 2014-08-18 NOTE — Op Note (Signed)
Procedure: Surveillance colonoscopy. 07/11/2013 colonoscopy with removal of a 25 mm sessile tubular adenoma from the ascending colon, a 5 mm cecal sessile serrated adenoma, and a 3 mm transverse colon tubular adenoma.  Endoscopist: Earle Gell  Premedication: Propofol administered by anesthesia  Procedure: The patient was placed in the left lateral decubitus position. Anal inspection and digital rectal exam were normal. The Pentax pediatric colonoscope was introduced into the rectum and advanced to the cecum. A normal-appearing ileocecal valve was intubated and the terminal ileum inspected. A normal-appearing appendiceal orifice was identified. Colonic preparation for the exam today was good. Withdrawal time was 22 minutes  Rectum. Normal. Retroflexed view of the distal rectum normal  Sigmoid colon and descending colon. Normal  Splenic flexure. Normal  Transverse colon. From the mid transverse colon, a 10 mm hyperplastic-appearing polyp was removed in piecemeal fashion with the electrocautery snare  Hepatic flexure. Normal  Ascending colon. Normal  Cecum and ileocecal valve. Normal  Terminal ileum. Normal  Assessment: From the mid transverse colon a 10 mm hyperplastic-appearing sessile polyp was removed with the electrocautery snare; otherwise normal colonoscopy  Recommendation: Schedule surveillance colonoscopy in 5 years

## 2014-08-18 NOTE — Anesthesia Preprocedure Evaluation (Addendum)
Anesthesia Evaluation  Patient identified by MRN, date of birth, ID band Patient awake    Reviewed: Allergy & Precautions, H&P , NPO status , Patient's Chart, lab work & pertinent test results  Airway Mallampati: II  TM Distance: >3 FB Neck ROM: Full    Dental no notable dental hx.    Pulmonary neg pulmonary ROS,  breath sounds clear to auscultation  Pulmonary exam normal       Cardiovascular hypertension, Pt. on medications Rhythm:Regular Rate:Normal     Neuro/Psych negative neurological ROS  negative psych ROS   GI/Hepatic negative GI ROS, Neg liver ROS,   Endo/Other  negative endocrine ROS  Renal/GU negative Renal ROS     Musculoskeletal negative musculoskeletal ROS (+)   Abdominal   Peds  Hematology negative hematology ROS (+)   Anesthesia Other Findings   Reproductive/Obstetrics                            Anesthesia Physical Anesthesia Plan  ASA: II  Anesthesia Plan: MAC   Post-op Pain Management:    Induction: Intravenous  Airway Management Planned:   Additional Equipment:   Intra-op Plan:   Post-operative Plan:   Informed Consent: I have reviewed the patients History and Physical, chart, labs and discussed the procedure including the risks, benefits and alternatives for the proposed anesthesia with the patient or authorized representative who has indicated his/her understanding and acceptance.   Dental advisory given  Plan Discussed with: CRNA  Anesthesia Plan Comments:         Anesthesia Quick Evaluation

## 2014-08-18 NOTE — Transfer of Care (Signed)
Immediate Anesthesia Transfer of Care Note  Patient: Patrick Brooks  Procedure(s) Performed: Procedure(s): COLONOSCOPY WITH PROPOFOL (N/A)  Patient Location: PACU  Anesthesia Type:MAC  Level of Consciousness: awake, alert  and oriented  Airway & Oxygen Therapy: Patient Spontanous Breathing and Patient connected to face mask oxygen  Post-op Assessment: Report given to PACU RN and Post -op Vital signs reviewed and stable  Post vital signs: Reviewed and stable  Complications: No apparent anesthesia complications

## 2014-08-18 NOTE — Anesthesia Postprocedure Evaluation (Signed)
Anesthesia Post Note  Patient: Patrick Brooks  Procedure(s) Performed: Procedure(s) (LRB): COLONOSCOPY WITH PROPOFOL (N/A)  Anesthesia type: MAC  Patient location: PACU  Post pain: Pain level controlled  Post assessment: Post-op Vital signs reviewed  Last Vitals: BP 113/80 mmHg  Pulse 50  Temp(Src) 36.5 C (Oral)  Resp 16  Ht 5\' 8"  (1.727 m)  Wt 190 lb (86.183 kg)  BMI 28.90 kg/m2  SpO2 100%  Post vital signs: Reviewed  Level of consciousness: awake  Complications: No apparent anesthesia complications

## 2014-08-18 NOTE — H&P (Signed)
  Procedure: Surveillance colonoscopy. 07/11/2013 colonoscopy with removal of a 25 mm sessile tubular adenoma from the ascending colon, a 5 mm cecal sessile serrated adenoma, and a 3 mm transverse colon tubular adenoma.  History: The patient is a 63 year old male born 1950/12/11. He is scheduled to undergo a surveillance colonoscopy today.  Medication allergies: None  Past medical history: Hypertension. Hypercholesterolemia. Rhinitis with nasal polyposis. Sinus surgery.  Exam: The patient is alert and lying comfortably on the endoscopy stretcher. Lungs are clear to auscultation. Cardiac exam reveals a regular rhythm. Abdomen is soft and nontender to palpation.  Plan: Proceed with surveillance colonoscopy

## 2014-08-19 ENCOUNTER — Encounter (HOSPITAL_COMMUNITY): Payer: Self-pay | Admitting: Gastroenterology

## 2014-08-19 ENCOUNTER — Ambulatory Visit (INDEPENDENT_AMBULATORY_CARE_PROVIDER_SITE_OTHER): Payer: 59 | Admitting: Cardiology

## 2014-08-19 VITALS — BP 134/82 | HR 83 | Ht 68.0 in | Wt 194.0 lb

## 2014-08-19 DIAGNOSIS — I119 Hypertensive heart disease without heart failure: Secondary | ICD-10-CM

## 2014-08-19 DIAGNOSIS — E7800 Pure hypercholesterolemia, unspecified: Secondary | ICD-10-CM

## 2014-08-19 LAB — LIPID PANEL
CHOL/HDL RATIO: 4
Cholesterol: 141 mg/dL (ref 0–200)
HDL: 35.7 mg/dL — AB (ref >39.00)
LDL CALC: 88 mg/dL (ref 0–99)
NONHDL: 105.3
TRIGLYCERIDES: 86 mg/dL (ref 0.0–149.0)
VLDL: 17.2 mg/dL (ref 0.0–40.0)

## 2014-08-19 LAB — HEPATIC FUNCTION PANEL
ALBUMIN: 4.2 g/dL (ref 3.5–5.2)
ALT: 33 U/L (ref 0–53)
AST: 20 U/L (ref 0–37)
Alkaline Phosphatase: 95 U/L (ref 39–117)
Bilirubin, Direct: 0.1 mg/dL (ref 0.0–0.3)
Total Bilirubin: 0.5 mg/dL (ref 0.2–1.2)
Total Protein: 6.9 g/dL (ref 6.0–8.3)

## 2014-08-19 LAB — BASIC METABOLIC PANEL
BUN: 12 mg/dL (ref 6–23)
CHLORIDE: 104 meq/L (ref 96–112)
CO2: 27 mEq/L (ref 19–32)
Calcium: 10 mg/dL (ref 8.4–10.5)
Creatinine, Ser: 1 mg/dL (ref 0.4–1.5)
GFR: 80.22 mL/min (ref >60.00)
Glucose, Bld: 99 mg/dL (ref 70–99)
POTASSIUM: 4 meq/L (ref 3.5–5.1)
Sodium: 136 mEq/L (ref 135–145)

## 2014-08-19 MED ORDER — VALSARTAN-HYDROCHLOROTHIAZIDE 160-12.5 MG PO TABS: 1.0000 | ORAL_TABLET | Freq: Every day | ORAL | Status: DC

## 2014-08-19 MED ORDER — LOVASTATIN 20 MG PO TABS: 20.0000 mg | ORAL_TABLET | Freq: Every day | ORAL | Status: AC

## 2014-08-19 NOTE — Patient Instructions (Signed)
Your physician recommends that you continue on your current medications as directed. Please refer to the Current Medication list given to you today.  Your physician wants you to follow-up in: 1 YEAR OV/LP/BMET/HFP  You will receive a reminder letter in the mail two months in advance. If you don't receive a letter, please call our office to schedule the follow-up appointment.

## 2014-08-19 NOTE — Progress Notes (Signed)
Patrick Brooks Date of Birth:  14-Jun-1951 [address] [place], [place]  [address] [phone]        Fax   [phone]   History of Present Illness: This pleasant 63 year old gentleman is seen for a followup office visit. 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  He works 12 hours a day.  He is now on the day shift rather than working the night shift.  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 He has been in good general health. He does have a past history of elevated cholesterol and a history of essential hypertension. Since last visit he has felt well with no new cardiac symptoms.He does not get much intentional aerobic exercise..   Current Outpatient Prescriptions  Medication Sig Dispense Refill  . aspirin 81 MG tablet Take 81 mg by mouth daily. Takes every two to three days.    . Cholecalciferol (VITAMIN D) 2000 UNITS tablet Take 2,000 Units by mouth daily.    [name] LORazepam (ATIVAN) 1 MG tablet Take 1 tablet (1 mg total) by mouth 2 (two) times daily as needed for anxiety. 15 tablet 0  . lovastatin (MEVACOR) 20 MG tablet Take 1 tablet (20 mg total) by mouth daily. 90 tablet 3  . sertraline (ZOLOFT) 50 MG tablet Take 75 mg by mouth every morning.    . valsartan-hydrochlorothiazide (DIOVAN-HCT) 160-12.5 MG per tablet Take 1 tablet by mouth daily. 90 tablet 3   No current facility-administered medications for this visit.    No Known Allergies  Patient Active Problem List   Diagnosis Date Noted  . Bronchitis 05/17/2013  . HTN (hypertension) 05/06/2011  . Hyperlipidemia 05/06/2011    History  Smoking status  . Never Smoker   Smokeless tobacco  . Never Used    History  Alcohol Use No    Family History  Problem Relation Age of Onset  . Alcohol abuse Father   . Alcohol abuse Brother     Review of Systems: Constitutional: no fever chills diaphoresis or fatigue or change in weight.  Head and neck: no  hearing loss, no epistaxis, no photophobia or visual disturbance. Respiratory: No cough, shortness of breath or wheezing. Cardiovascular: No chest pain peripheral edema, palpitations. Gastrointestinal: No abdominal distention, no abdominal pain, no change in bowel habits hematochezia or melena. Genitourinary: No dysuria, no frequency, no urgency, no nocturia. Musculoskeletal:No arthralgias, no back pain, no gait disturbance or myalgias. Neurological: No dizziness, no headaches, no numbness, no seizures, no syncope, no weakness, no tremors. Hematologic: No lymphadenopathy, no easy bruising. Psychiatric: No confusion, no hallucinations, no sleep disturbance.   Wt Readings from Last 3 Encounters:  08/19/14 194 lb (87.998 kg)  08/18/14 190 lb (86.183 kg)  03/26/14 190 lb (86.183 kg)    Physical Exam: Filed Vitals:   08/19/14 1541  BP: 134/82  Pulse: 83  The patient appears to be in no distress.  Head and neck exam reveals that the pupils are equal and reactive.  The extraocular movements are full.  There is no scleral icterus.  Mouth and pharynx are benign.  No lymphadenopathy.  No carotid bruits.  The jugular venous pressure is normal.  Thyroid is not enlarged or tender.  Chest is clear to percussion and auscultation.  No rales or rhonchi.  Expansion of the chest is symmetrical.  Heart reveals no abnormal lift or heave.  First and second heart sounds are normal.  There is no murmur gallop rub or click.  The abdomen is soft and nontender.  Bowel sounds are normoactive.  There is no hepatosplenomegaly or mass.  There are no abdominal bruits.  Extremities reveal no phlebitis or edema.  Pedal pulses are good.  There is no cyanosis or clubbing.  Neurologic exam is normal strength and no lateralizing weakness.  No sensory deficits.  Integument reveals no rash    Assessment / Plan: 1.  Hypertensive heart disease without heart failure 2.  Hypercholesterolemia 3.  Past history of colon  polyp.  He had a repeat colonoscopy yesterday by Dr. Earle Gell.  The colonoscopy was clear and he does not need to have another one for 5 years.  Disposition: Continue on same medication.  We reviewed his lab work today which is satisfactory.  Recheck in one year for office visit lipid panel hepatic function panel and basal metabolic panel.

## 2014-08-20 ENCOUNTER — Other Ambulatory Visit: Payer: Self-pay

## 2015-02-10 ENCOUNTER — Other Ambulatory Visit: Payer: Self-pay | Admitting: Cardiology

## 2015-08-13 ENCOUNTER — Ambulatory Visit: Payer: Self-pay | Admitting: Cardiology

## 2015-09-14 ENCOUNTER — Other Ambulatory Visit: Payer: Self-pay | Admitting: Cardiology

## 2015-09-15 MED FILL — SERTRALINE HCL 50 MG TABLET: 50 | 90 days supply | Qty: 180 | Fill #0

## 2015-09-16 MED FILL — VALSARTAN-HCTZ 160-12.5 MG: 160-12.5 | 90 days supply | Qty: 90 | Fill #0

## 2015-09-18 ENCOUNTER — Encounter: Payer: Self-pay | Admitting: Cardiology

## 2015-09-18 ENCOUNTER — Ambulatory Visit (INDEPENDENT_AMBULATORY_CARE_PROVIDER_SITE_OTHER): Payer: 59 | Admitting: Cardiology

## 2015-09-18 VITALS — BP 130/72 | HR 68 | Ht 68.0 in | Wt 202.8 lb

## 2015-09-18 DIAGNOSIS — I119 Hypertensive heart disease without heart failure: Secondary | ICD-10-CM | POA: Diagnosis not present

## 2015-09-18 DIAGNOSIS — E7800 Pure hypercholesterolemia, unspecified: Secondary | ICD-10-CM

## 2015-09-18 LAB — BASIC METABOLIC PANEL
BUN: 23 mg/dL (ref 7–25)
CHLORIDE: 106 mmol/L (ref 98–110)
CO2: 26 mmol/L (ref 20–31)
CREATININE: 1.06 mg/dL (ref 0.70–1.25)
Calcium: 10.1 mg/dL (ref 8.6–10.3)
Glucose, Bld: 102 mg/dL — ABNORMAL HIGH (ref 65–99)
Potassium: 4 mmol/L (ref 3.5–5.3)
Sodium: 138 mmol/L (ref 135–146)

## 2015-09-18 LAB — LIPID PANEL
Cholesterol: 147 mg/dL (ref 125–200)
HDL: 35 mg/dL — ABNORMAL LOW (ref >=40)
LDL CALC: 93 mg/dL (ref <130)
TRIGLYCERIDES: 97 mg/dL (ref <150)
Total CHOL/HDL Ratio: 4.2 Ratio (ref <=5.0)
VLDL: 19 mg/dL (ref <30)

## 2015-09-18 LAB — HEPATIC FUNCTION PANEL
ALK PHOS: 78 U/L (ref 40–115)
ALT: 34 U/L (ref 9–46)
AST: 24 U/L (ref 10–35)
Albumin: 4.2 g/dL (ref 3.6–5.1)
BILIRUBIN INDIRECT: 0.5 mg/dL (ref 0.2–1.2)
Bilirubin, Direct: 0.1 mg/dL (ref <=0.2)
Total Bilirubin: 0.6 mg/dL (ref 0.2–1.2)
Total Protein: 6.9 g/dL (ref 6.1–8.1)

## 2015-09-18 NOTE — Patient Instructions (Signed)
Medication Instructions:  Your physician recommends that you continue on your current medications as directed. Please refer to the Current Medication list given to you today.  Labwork: Lp/bmet/hfp  Testing/Procedures: none  Follow-Up: As needed   If you need a refill on your cardiac medications before your next appointment, please call your pharmacy.

## 2015-09-18 NOTE — Progress Notes (Signed)
Cardiology Office Note   Date:  09/18/2015   ID:  Patrick Brooks, DOB October 29, 1950, MRN [id number]  PCP:  Henrine Screws, MD  Cardiologist: Darlin Coco MD  Chief Complaint  Patient presents with  . Hypertension    denies chest pain, shortness of breath, and le edema      History of Present Illness: Patrick Brooks is a 65 y.o. male who presents for a one-year follow-up office visit  This pleasant 65 year old gentleman is seen for a followup office visit. 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 He works 12 hours a day. He is now on the day shift rather than working the night shift. 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 He has been in good general health. He does have a past history of elevated cholesterol and a history of essential hypertension. Since last visit he has felt well with no new cardiac symptoms.He does not get much intentional aerobic exercise.[name] His weight is up today but he was wearing heavy close and heavy boots. Since last year he has had no new cardiac symptoms.  Specifically no chest pain or shortness of breath or dizziness or syncope or peripheral edema.  Past Medical History  Diagnosis Date  . Hypertension   . Hyperlipidemia   . Hypercholesterolemia   . Anxiety     Past Surgical History  Procedure Laterality Date  . Nasal polyp surgery  12/24/2010  . Colonoscopy with propofol N/A 08/18/2014    Procedure: COLONOSCOPY WITH PROPOFOL;  Surgeon: Garlan Fair, MD;  Location: [facility];  Service: Endoscopy;  Laterality: N/A;     Current Outpatient Prescriptions  Medication Sig Dispense Refill  . aspirin 81 MG tablet Take 81 mg by mouth daily. Takes every two to three days.    . Cholecalciferol (VITAMIN D) 2000 UNITS tablet Take 2,000 Units by mouth daily.    [name] LORazepam (ATIVAN) 1 MG tablet Take 1 tablet (1 mg total) by mouth 2 (two) times daily as needed for anxiety. 15 tablet 0  . lovastatin (MEVACOR)  20 MG tablet Take 1 tablet (20 mg total) by mouth daily. 90 tablet 3  . sertraline (ZOLOFT) 50 MG tablet Take 100 mg by mouth every morning.     . valsartan-hydrochlorothiazide (DIOVAN-HCT) 160-12.5 MG tablet TAKE 1 TABLET BY MOUTH ONCE DAILY 90 tablet 3   No current facility-administered medications for this visit.    Allergies:   Review of patient's allergies indicates no known allergies.    Social History:  The patient  reports that he has never smoked. He has never used smokeless tobacco. He reports that he does not drink alcohol or use illicit drugs.   Family History:  The patient's family history includes Alcohol abuse in his brother and father.    ROS:  Please see the history of present illness.   Otherwise, review of systems are positive for none.   All other systems are reviewed and negative.    PHYSICAL EXAM: VS:  BP 130/72 mmHg  Pulse 68  Ht 5\' 8"  (1.727 m)  Wt 202 lb 12.8 oz (91.989 kg)  BMI 30.84 kg/m2 , BMI Body mass index is 30.84 kg/(m^2). GEN: Well nourished, well developed, in no acute distress HEENT: normal Neck: no JVD, carotid bruits, or masses Cardiac: RRR; no murmurs, rubs, or gallops,no edema  Respiratory:  clear to auscultation bilaterally, normal work of breathing GI: soft, nontender, nondistended, + BS MS: no  deformity or atrophy Skin: warm and dry, no rash Neuro:  Strength and sensation are intact Psych: euthymic mood, full affect   EKG:  EKG is ordered today. The ekg ordered today demonstrates normal sinus rhythm.  Within normal limits.   Recent Labs: No results found for requested labs within last 365 days.    Lipid Panel    Component Value Date/Time   CHOL 141 08/19/2014 1304   TRIG 86.0 08/19/2014 1304   HDL 35.70* 08/19/2014 1304   CHOLHDL 4 08/19/2014 1304   VLDL 17.2 08/19/2014 1304   LDLCALC 88 08/19/2014 1304      Wt Readings from Last 3 Encounters:  09/18/15 202 lb 12.8 oz (91.989 kg)  08/19/14 194 lb (87.998 kg)    08/18/14 190 lb (86.183 kg)        ASSESSMENT AND PLAN:  1. Hypertensive heart disease without heart failure.  Blood pressure is stable on current meds 2. Hypercholesterolemia.  We are checking labs today 3. Past history of colon polyp. He had a repeat colonoscopy last year by Dr. Earle Gell. The colonoscopy was clear and he does not need to have another one for 4 years.  Disposition: Continue on same medication.  Try to lose weight.  Await results of today's lab work.  Following my retirement he will return here on a when necessary basis.  He will continue regular follow-up with his PCP Dr. Inda Merlin  Current medicines are reviewed at length with the patient today.  The patient does not have concerns regarding medicines.  The following changes have been made:  no change  Labs/ tests ordered today include:   Orders Placed This Encounter  Procedures  . Lipid panel  . Hepatic function panel  . Basic metabolic panel  . EKG 12-Lead      Signed, Darlin Coco MD 09/18/2015 1:32 PM    [facility] [address], [place], [place]  [address] Phone: [phone]; Fax: [phone]

## 2015-09-19 NOTE — Progress Notes (Signed)
Quick Note:  Please report to patient. The recent labs are stable. Continue same medication and careful diet. ______ 

## 2015-12-04 DIAGNOSIS — H524 Presbyopia: Secondary | ICD-10-CM | POA: Diagnosis not present

## 2015-12-11 DIAGNOSIS — L812 Freckles: Secondary | ICD-10-CM | POA: Diagnosis not present

## 2015-12-11 DIAGNOSIS — L821 Other seborrheic keratosis: Secondary | ICD-10-CM | POA: Diagnosis not present

## 2015-12-11 DIAGNOSIS — L723 Sebaceous cyst: Secondary | ICD-10-CM | POA: Diagnosis not present

## 2015-12-11 DIAGNOSIS — L218 Other seborrheic dermatitis: Secondary | ICD-10-CM | POA: Diagnosis not present

## 2015-12-11 DIAGNOSIS — D1801 Hemangioma of skin and subcutaneous tissue: Secondary | ICD-10-CM | POA: Diagnosis not present

## 2015-12-11 DIAGNOSIS — D225 Melanocytic nevi of trunk: Secondary | ICD-10-CM | POA: Diagnosis not present

## 2015-12-11 DIAGNOSIS — L570 Actinic keratosis: Secondary | ICD-10-CM | POA: Diagnosis not present

## 2015-12-18 ENCOUNTER — Other Ambulatory Visit: Payer: Self-pay | Admitting: Cardiology

## 2015-12-18 MED FILL — SERTRALINE HCL 50 MG TABLET: 50 | 90 days supply | Qty: 180 | Fill #1

## 2015-12-18 MED FILL — VALSARTAN-HCTZ 160-12.5 MG: 160-12.5 | 90 days supply | Qty: 90 | Fill #1

## 2015-12-23 ENCOUNTER — Other Ambulatory Visit: Payer: Self-pay | Admitting: *Deleted

## 2016-01-04 NOTE — Telephone Encounter (Signed)
Left vm on home # to call back and sched med refill appt w/ New MD

## 2016-01-04 NOTE — Telephone Encounter (Signed)
Call transferred from Operator, pt questioning why he needs to schedule an appt with a new MD.  When Dr Mare Ferrari last saw patient he told him further cardiology follow up was not needed at this time and patient can get prescription refills from Dr Inda Merlin his PCP.  After reviewing Dr Sherryl Barters notes writer noted same information.  Pt verbalized understanding and will follow up with Dr Inda Merlin.   Georgana Curio MHA RN CCM

## 2016-01-22 MED FILL — LOVASTATIN 20 MG TABLET: 20 | 90 days supply | Qty: 90 | Fill #0

## 2016-04-04 MED FILL — SERTRALINE HCL 50 MG TABLET: 50 | 90 days supply | Qty: 180 | Fill #0

## 2016-04-04 MED FILL — VALSARTAN-HCTZ 160-12.5 MG: 160-12.5 | 90 days supply | Qty: 90 | Fill #2

## 2016-06-10 DIAGNOSIS — L570 Actinic keratosis: Secondary | ICD-10-CM | POA: Diagnosis not present

## 2016-06-13 IMAGING — CR DG CHEST 2V
2 series · 2 of 2 positions shown · non-contrast
Comparison: None.

CLINICAL DATA: Chest pressure, lightheadedness

EXAM:
CHEST  2 VIEW

[w chest pa]
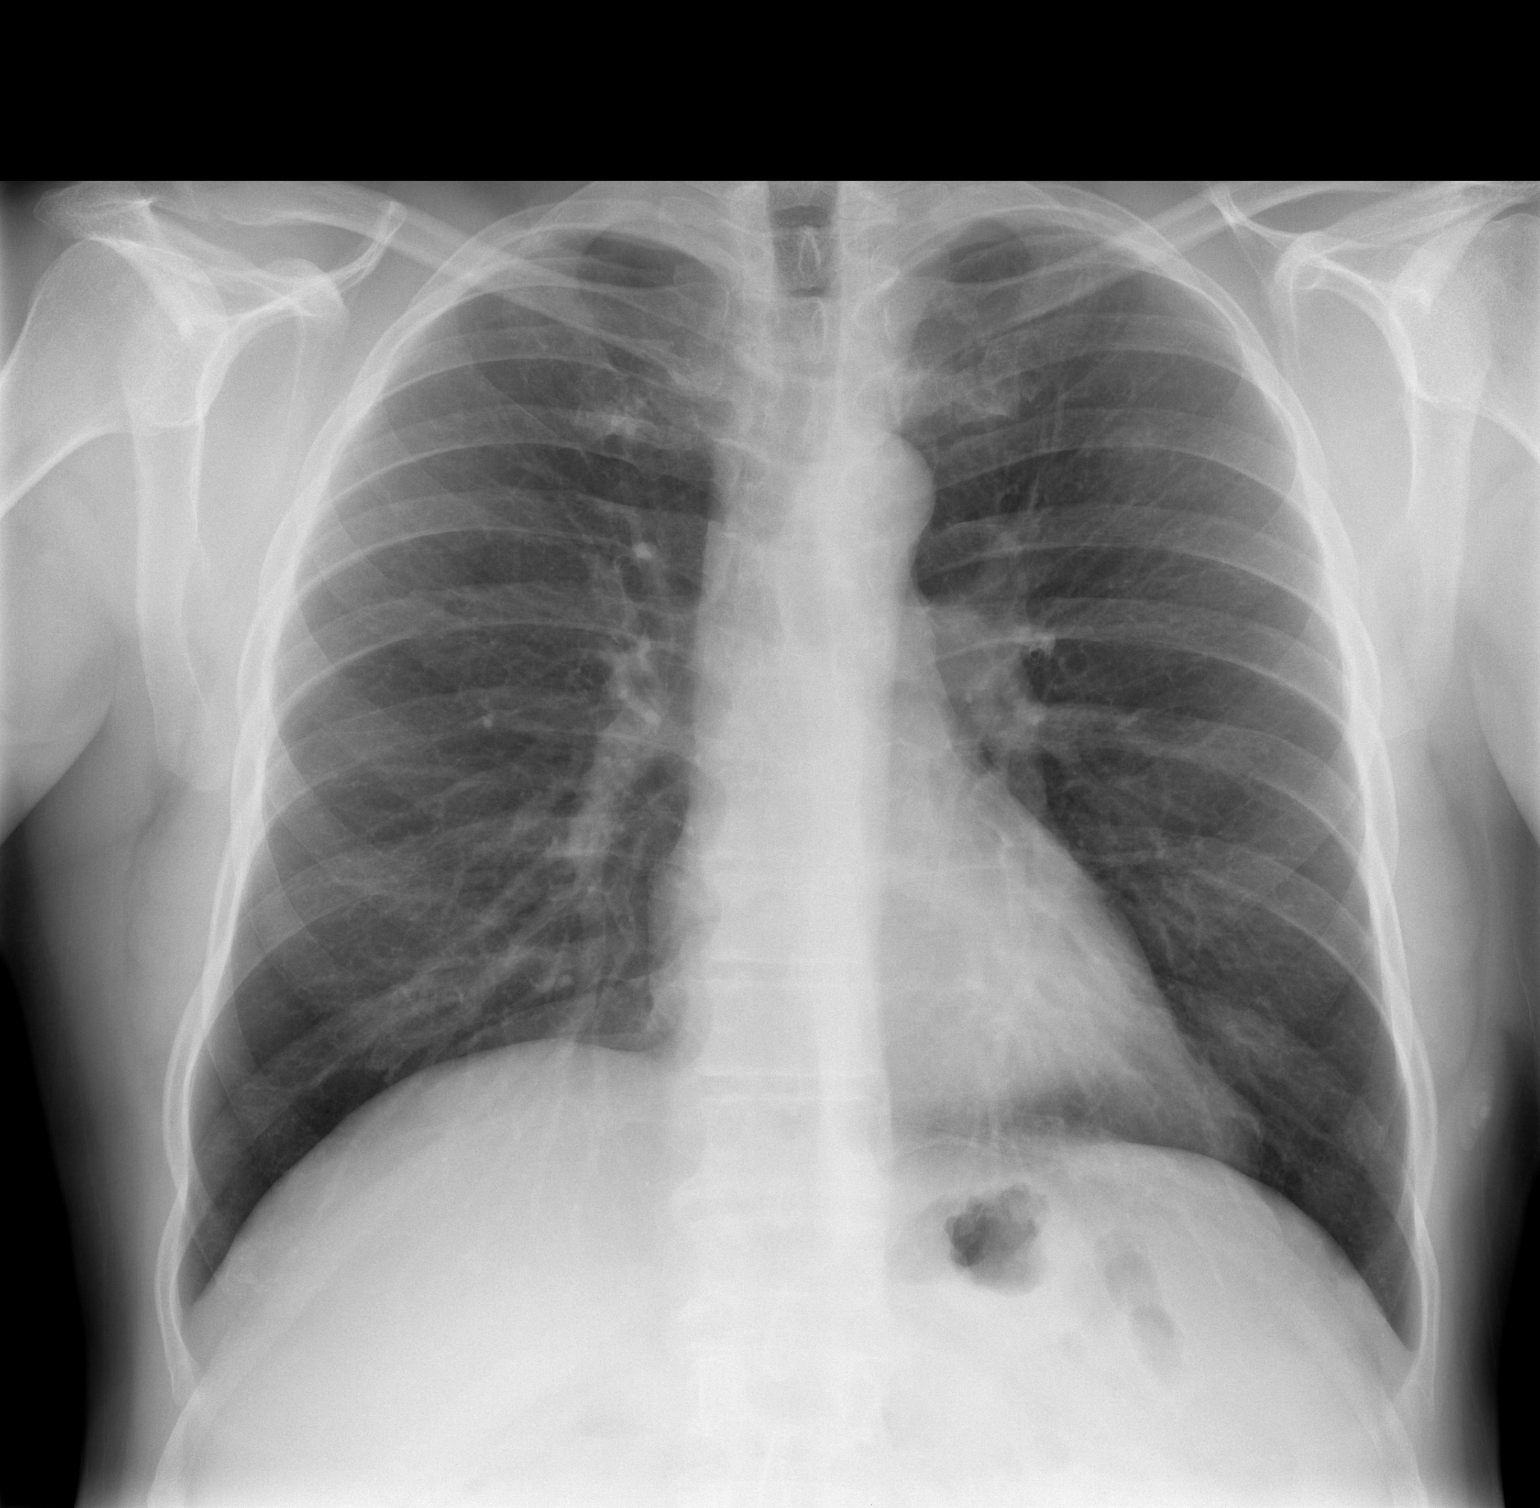

[w chest lat]
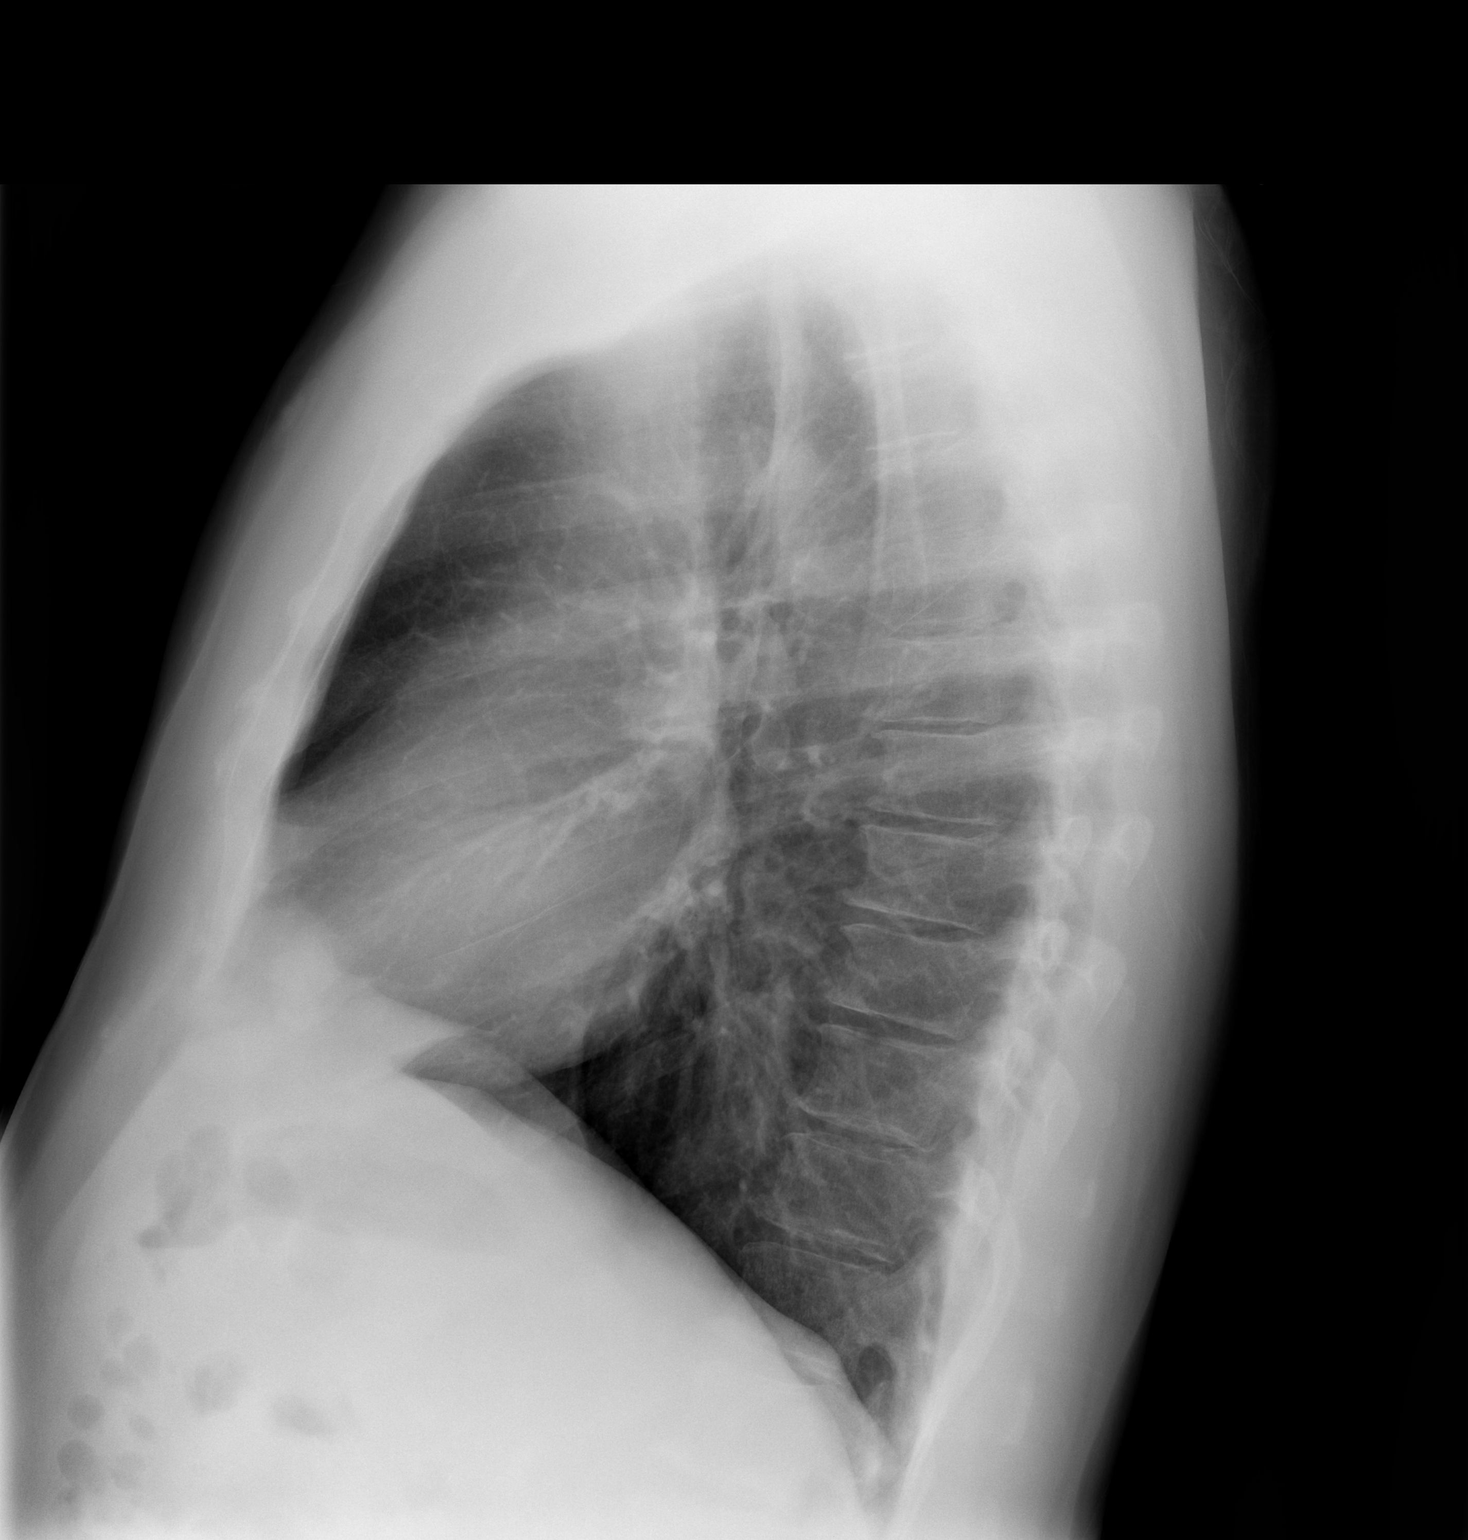

[2 of 2 positions shown; findings below may reference images not displayed]

FINDINGS: The heart size and mediastinal contours are within normal limits.
Both lungs are clear. The visualized skeletal structures are
unremarkable.
IMPRESSION: No active cardiopulmonary disease.

## 2016-06-23 ENCOUNTER — Other Ambulatory Visit: Payer: Self-pay | Admitting: Internal Medicine

## 2016-06-23 ENCOUNTER — Ambulatory Visit
Admission: RE | Admit: 2016-06-23 | Discharge: 2016-06-23 | Disposition: A | Discharge status: Home / Self Care | Payer: 59 | Source: Ambulatory Visit | Attending: Internal Medicine | Admitting: Internal Medicine

## 2016-06-23 DIAGNOSIS — Z0000 Encounter for general adult medical examination without abnormal findings: Secondary | ICD-10-CM | POA: Diagnosis not present

## 2016-06-23 DIAGNOSIS — R0989 Other specified symptoms and signs involving the circulatory and respiratory systems: Secondary | ICD-10-CM

## 2016-06-23 DIAGNOSIS — Z79899 Other long term (current) drug therapy: Secondary | ICD-10-CM | POA: Diagnosis not present

## 2016-06-23 DIAGNOSIS — E559 Vitamin D deficiency, unspecified: Secondary | ICD-10-CM | POA: Diagnosis not present

## 2016-06-23 DIAGNOSIS — I10 Essential (primary) hypertension: Secondary | ICD-10-CM | POA: Diagnosis not present

## 2016-06-23 DIAGNOSIS — M2010 Hallux valgus (acquired), unspecified foot: Secondary | ICD-10-CM | POA: Diagnosis not present

## 2016-06-23 DIAGNOSIS — Z0389 Encounter for observation for other suspected diseases and conditions ruled out: Secondary | ICD-10-CM | POA: Diagnosis not present

## 2016-06-23 DIAGNOSIS — J301 Allergic rhinitis due to pollen: Secondary | ICD-10-CM | POA: Diagnosis not present

## 2016-06-23 DIAGNOSIS — E785 Hyperlipidemia, unspecified: Secondary | ICD-10-CM | POA: Diagnosis not present

## 2016-06-23 DIAGNOSIS — D126 Benign neoplasm of colon, unspecified: Secondary | ICD-10-CM | POA: Diagnosis not present

## 2016-06-23 DIAGNOSIS — Z600 Problems of adjustment to life-cycle transitions: Secondary | ICD-10-CM | POA: Diagnosis not present

## 2016-06-23 MED FILL — VALSARTAN-HCTZ 160-12.5 MG: 160-12.5 | 90 days supply | Qty: 90 | Fill #0

## 2016-06-23 MED FILL — SERTRALINE HCL 100 MG TAB: 100 | 90 days supply | Qty: 90 | Fill #0

## 2016-06-23 MED FILL — LOVASTATIN 40 MG TABLET: 40 | 90 days supply | Qty: 90 | Fill #0

## 2016-07-06 DIAGNOSIS — J322 Chronic ethmoidal sinusitis: Secondary | ICD-10-CM | POA: Diagnosis not present

## 2016-07-06 DIAGNOSIS — Z6829 Body mass index (BMI) 29.0-29.9, adult: Secondary | ICD-10-CM | POA: Diagnosis not present

## 2016-10-05 MED FILL — SERTRALINE HCL 100 MG TAB: 100 | 90 days supply | Qty: 90 | Fill #1

## 2016-11-03 MED FILL — VALSARTAN-HCTZ 160-12.5 MG: 160-12.5 | 90 days supply | Qty: 90 | Fill #1

## 2016-11-03 MED FILL — LOVASTATIN 40 MG TABLET: 40 | 90 days supply | Qty: 90 | Fill #1

## 2016-12-17 DIAGNOSIS — Z6830 Body mass index (BMI) 30.0-30.9, adult: Secondary | ICD-10-CM | POA: Diagnosis not present

## 2016-12-17 DIAGNOSIS — J111 Influenza due to unidentified influenza virus with other respiratory manifestations: Secondary | ICD-10-CM | POA: Diagnosis not present

## 2016-12-17 DIAGNOSIS — J029 Acute pharyngitis, unspecified: Secondary | ICD-10-CM | POA: Diagnosis not present

## 2016-12-26 DIAGNOSIS — H524 Presbyopia: Secondary | ICD-10-CM | POA: Diagnosis not present

## 2016-12-26 DIAGNOSIS — H5203 Hypermetropia, bilateral: Secondary | ICD-10-CM | POA: Diagnosis not present

## 2017-01-13 MED FILL — SERTRALINE HCL 100 MG TAB: 100 | 90 days supply | Qty: 90 | Fill #2

## 2017-02-24 MED FILL — VALSARTAN-HCTZ 160-12.5 MG: 160-12.5 | 90 days supply | Qty: 90 | Fill #0

## 2017-03-14 DIAGNOSIS — L720 Epidermal cyst: Secondary | ICD-10-CM | POA: Diagnosis not present

## 2017-03-14 MED FILL — CEPHALEXIN 500 MG CAPSULE: 500 | 7 days supply | Qty: 14 | Fill #0

## 2017-04-13 MED FILL — SERTRALINE HCL 100 MG TAB: 100 | 90 days supply | Qty: 90 | Fill #3

## 2017-05-09 DIAGNOSIS — L570 Actinic keratosis: Secondary | ICD-10-CM | POA: Diagnosis not present

## 2017-05-09 DIAGNOSIS — L578 Other skin changes due to chronic exposure to nonionizing radiation: Secondary | ICD-10-CM | POA: Diagnosis not present

## 2017-05-09 DIAGNOSIS — D1801 Hemangioma of skin and subcutaneous tissue: Secondary | ICD-10-CM | POA: Diagnosis not present

## 2017-05-09 DIAGNOSIS — B351 Tinea unguium: Secondary | ICD-10-CM | POA: Diagnosis not present

## 2017-05-09 DIAGNOSIS — L812 Freckles: Secondary | ICD-10-CM | POA: Diagnosis not present

## 2017-06-07 MED FILL — VALSARTAN-HCTZ 160-12.5 MG: 160-12.5 | 30 days supply | Qty: 30 | Fill #0

## 2017-07-12 MED FILL — VALSARTAN-HCTZ 160-12.5 MG: 160-12.5 | 30 days supply | Qty: 30 | Fill #1

## 2017-08-22 MED FILL — SERTRALINE HCL 100 MG TAB: 100 | 90 days supply | Qty: 90 | Fill #0

## 2017-08-22 MED FILL — VALSARTAN-HCTZ 160-12.5 MG: 160-12.5 | 30 days supply | Qty: 30 | Fill #2

## 2017-10-02 MED FILL — VALSARTAN-HCTZ 160-12.5 MG: 160-12.5 | 30 days supply | Qty: 30 | Fill #0

## 2017-10-26 DIAGNOSIS — D126 Benign neoplasm of colon, unspecified: Secondary | ICD-10-CM | POA: Diagnosis not present

## 2017-10-26 DIAGNOSIS — E7800 Pure hypercholesterolemia, unspecified: Secondary | ICD-10-CM | POA: Diagnosis not present

## 2017-10-26 DIAGNOSIS — Z0000 Encounter for general adult medical examination without abnormal findings: Secondary | ICD-10-CM | POA: Diagnosis not present

## 2017-10-26 DIAGNOSIS — R69 Illness, unspecified: Secondary | ICD-10-CM | POA: Diagnosis not present

## 2017-10-26 DIAGNOSIS — I10 Essential (primary) hypertension: Secondary | ICD-10-CM | POA: Diagnosis not present

## 2017-10-26 DIAGNOSIS — Z79899 Other long term (current) drug therapy: Secondary | ICD-10-CM | POA: Diagnosis not present

## 2017-10-26 DIAGNOSIS — M2010 Hallux valgus (acquired), unspecified foot: Secondary | ICD-10-CM | POA: Diagnosis not present

## 2017-10-26 DIAGNOSIS — J301 Allergic rhinitis due to pollen: Secondary | ICD-10-CM | POA: Diagnosis not present

## 2017-10-26 DIAGNOSIS — E559 Vitamin D deficiency, unspecified: Secondary | ICD-10-CM | POA: Diagnosis not present

## 2017-10-26 DIAGNOSIS — Z23 Encounter for immunization: Secondary | ICD-10-CM | POA: Diagnosis not present

## 2017-11-01 MED FILL — VALSARTAN-HCTZ 160-12.5 MG: 160-12.5 | 30 days supply | Qty: 30 | Fill #1

## 2017-12-13 MED FILL — VALSARTAN-HCTZ 160-12.5 MG: 160-12.5 | 90 days supply | Qty: 90 | Fill #0

## 2018-03-21 MED FILL — VALSARTAN-HCTZ 160-12.5 MG: 160-12.5 | 90 days supply | Qty: 90 | Fill #1

## 2018-04-11 MED FILL — SERTRALINE HCL 100 MG TAB: 100 | 90 days supply | Qty: 90 | Fill #1

## 2018-05-04 DIAGNOSIS — L812 Freckles: Secondary | ICD-10-CM | POA: Diagnosis not present

## 2018-05-04 DIAGNOSIS — L821 Other seborrheic keratosis: Secondary | ICD-10-CM | POA: Diagnosis not present

## 2018-05-04 DIAGNOSIS — D1801 Hemangioma of skin and subcutaneous tissue: Secondary | ICD-10-CM | POA: Diagnosis not present

## 2018-05-04 DIAGNOSIS — L570 Actinic keratosis: Secondary | ICD-10-CM | POA: Diagnosis not present

## 2018-05-16 DIAGNOSIS — H524 Presbyopia: Secondary | ICD-10-CM | POA: Diagnosis not present

## 2018-05-16 DIAGNOSIS — H5203 Hypermetropia, bilateral: Secondary | ICD-10-CM | POA: Diagnosis not present

## 2018-06-27 DIAGNOSIS — Z23 Encounter for immunization: Secondary | ICD-10-CM | POA: Diagnosis not present

## 2018-06-27 MED FILL — VALSARTAN-HCTZ 160-12.5 MG: 160-12.5 | 90 days supply | Qty: 90 | Fill #2

## 2018-07-25 MED FILL — SERTRALINE HCL 100 MG TAB: 100 | 90 days supply | Qty: 90 | Fill #2

## 2018-09-11 IMAGING — CR DG CHEST 2V
2 series · 2 of 2 positions shown · non-contrast
Comparison: None.

CLINICAL DATA: Rales heard on exam, history of hypertension

EXAM:
CHEST  2 VIEW

[w chest pa]
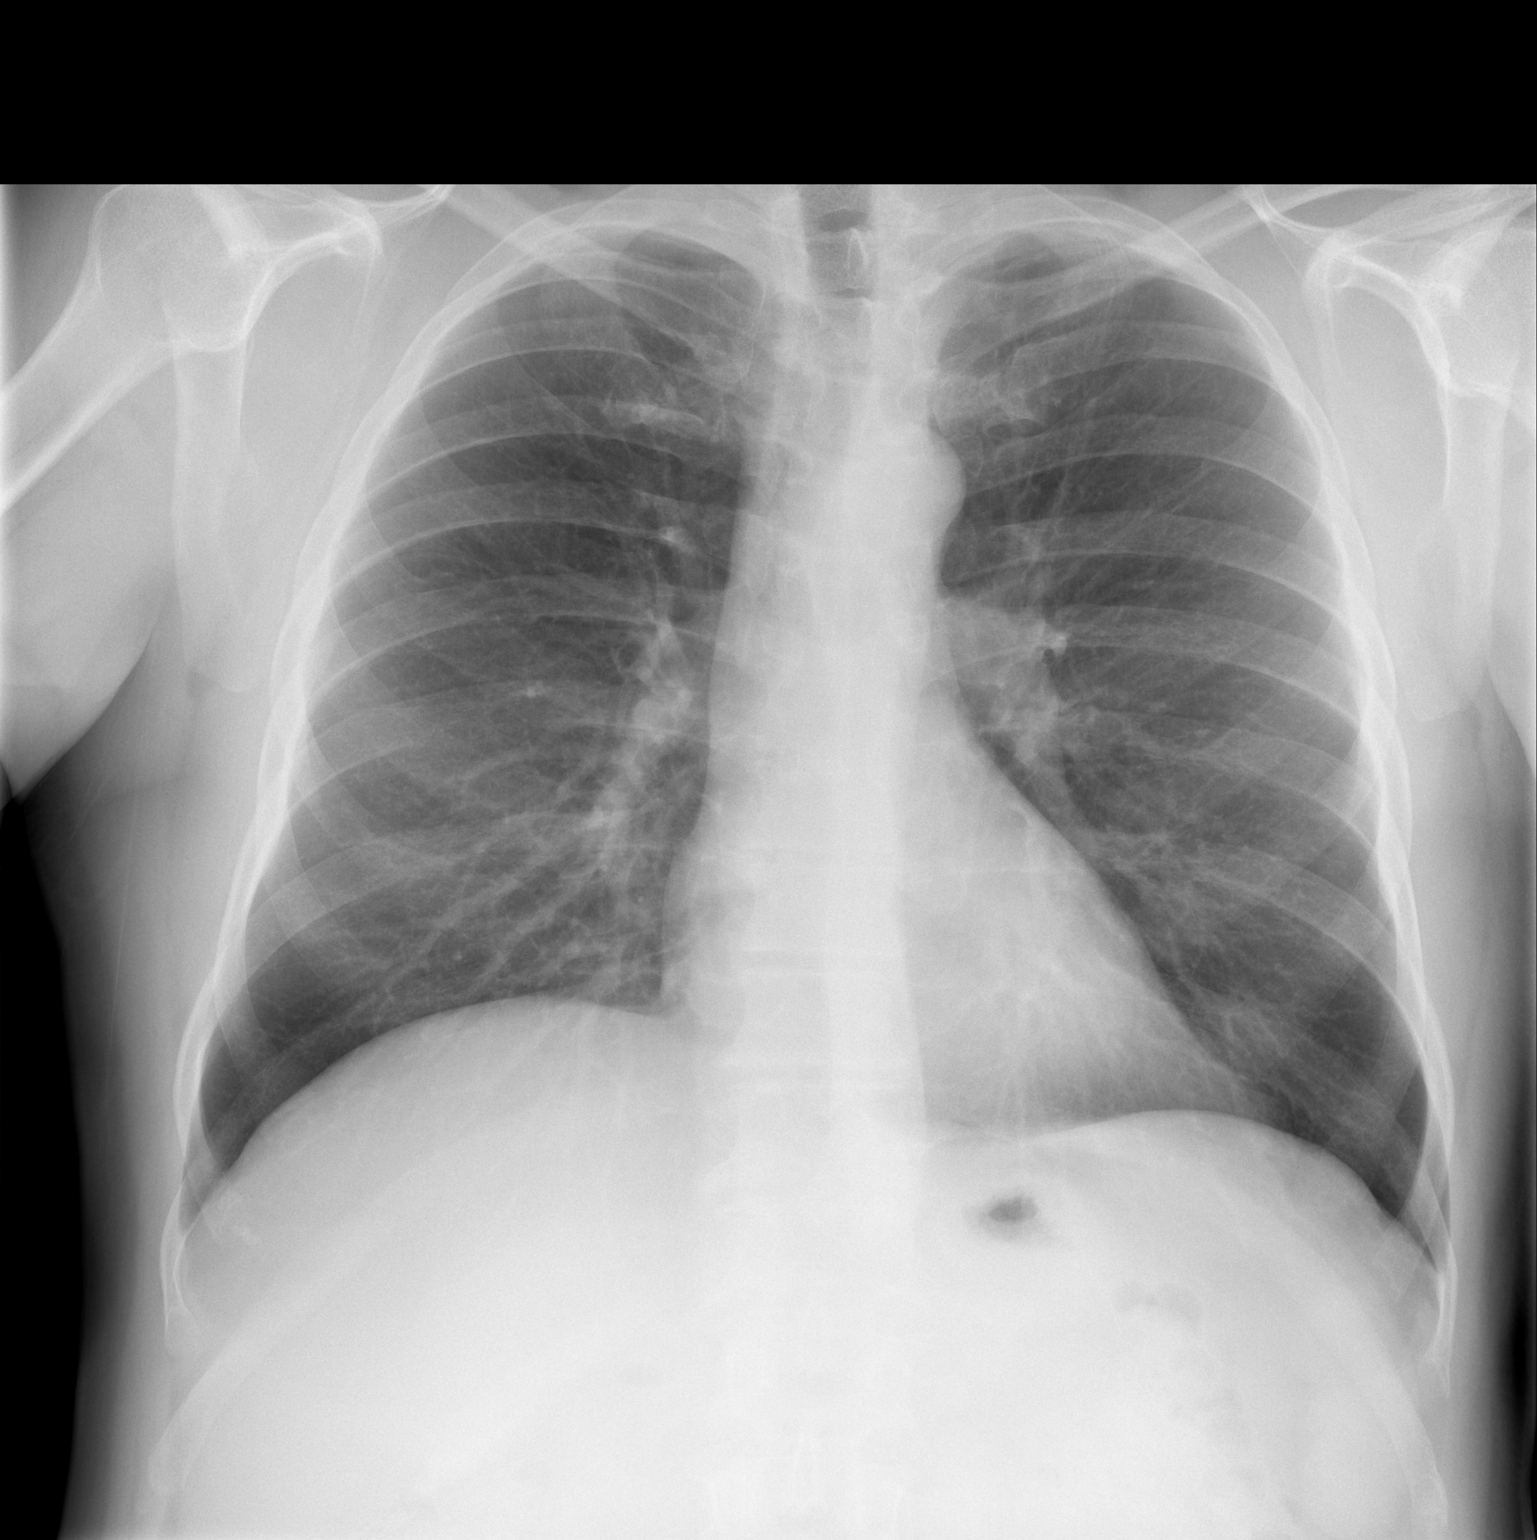

[w chest lat]
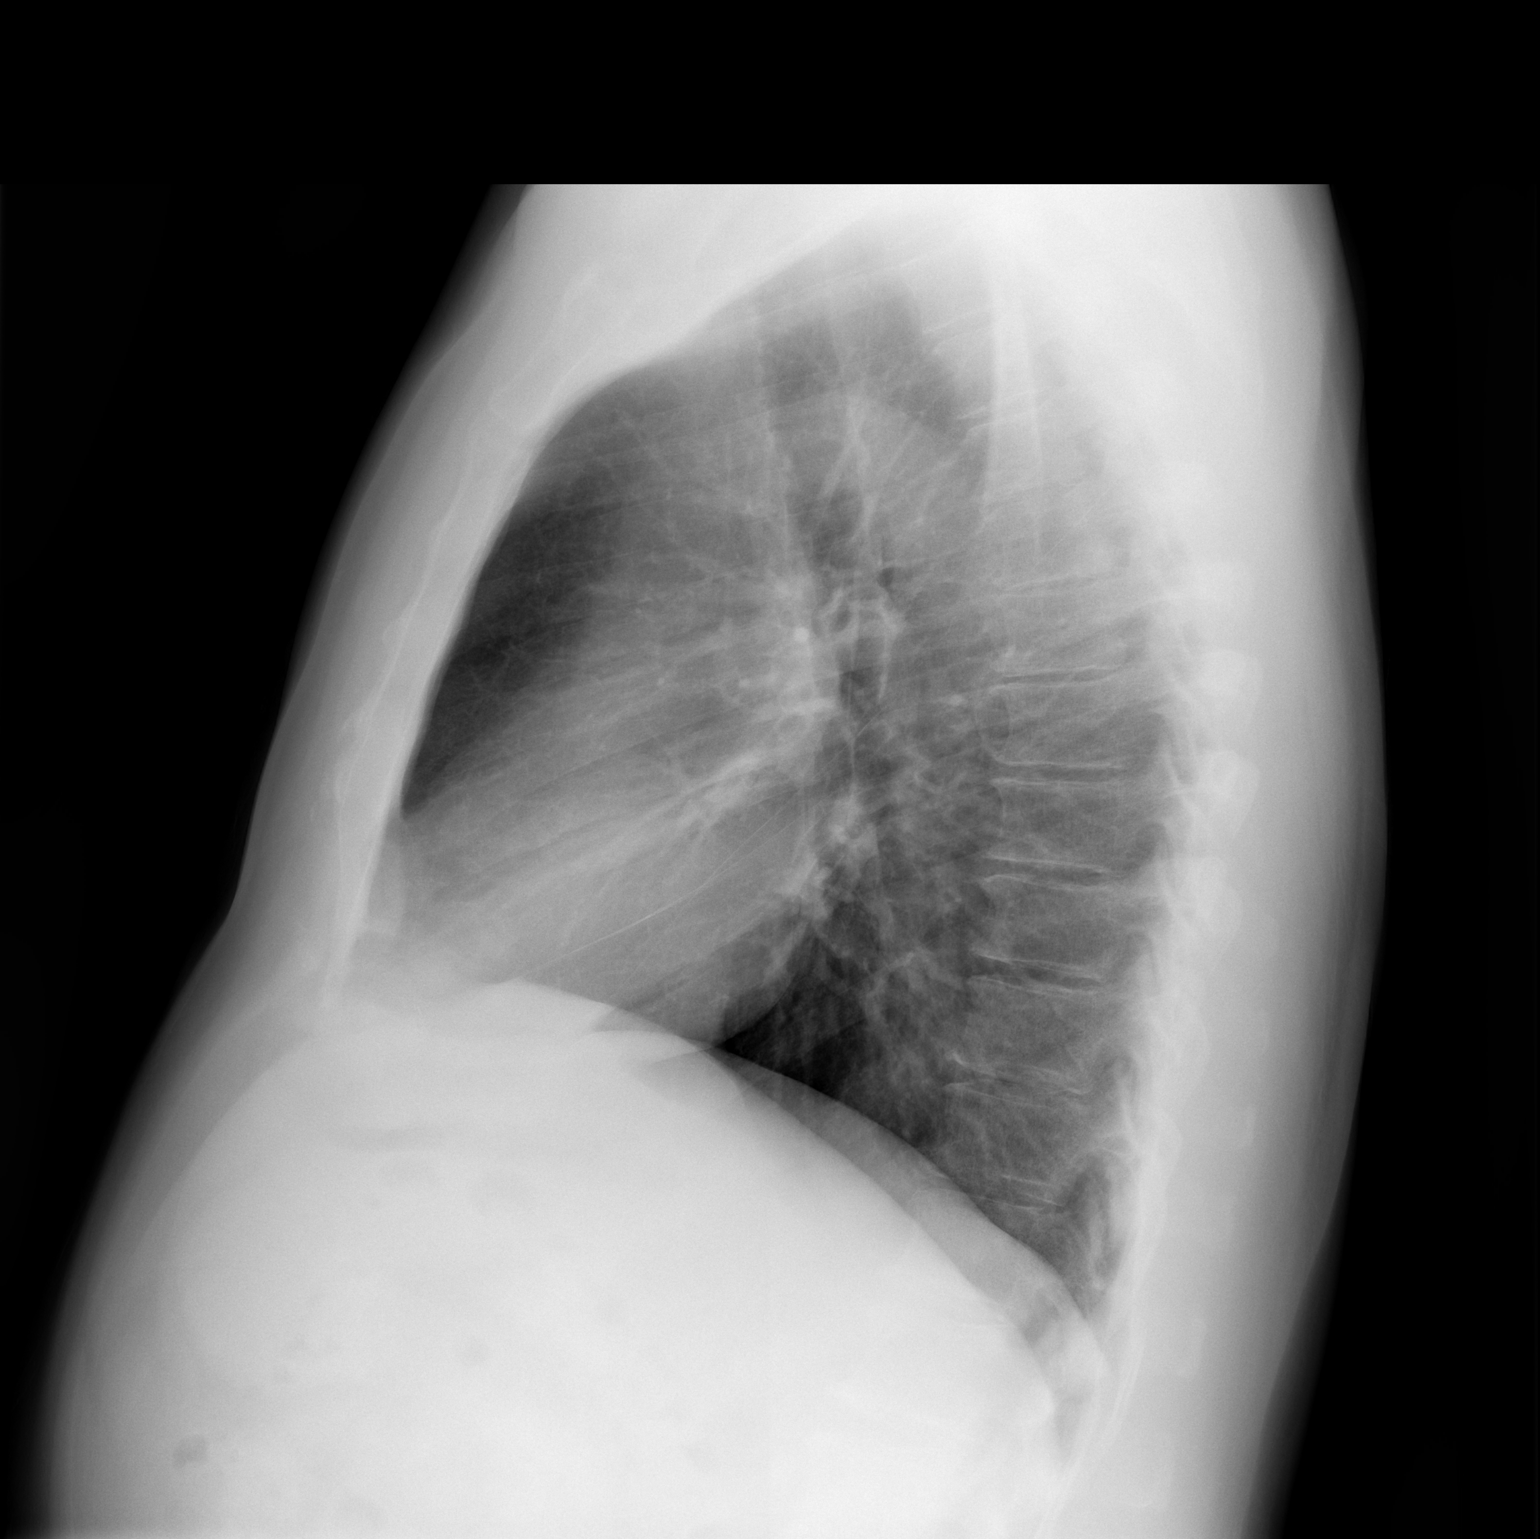

[2 of 2 positions shown; findings below may reference images not displayed]

FINDINGS: The heart size and mediastinal contours are within normal limits.
Both lungs are clear. The visualized skeletal structures are
unremarkable.
IMPRESSION: No active cardiopulmonary disease.

## 2018-10-04 MED FILL — VALSARTAN-HCTZ 160-12.5 MG: 160-12.5 | 90 days supply | Qty: 90 | Fill #0

## 2018-10-22 MED FILL — SERTRALINE HCL 100 MG TAB: 100 | 90 days supply | Qty: 90 | Fill #0

## 2018-11-07 DIAGNOSIS — D126 Benign neoplasm of colon, unspecified: Secondary | ICD-10-CM | POA: Diagnosis not present

## 2018-11-07 DIAGNOSIS — H6243 Otitis externa in other diseases classified elsewhere, bilateral: Secondary | ICD-10-CM | POA: Diagnosis not present

## 2018-11-07 DIAGNOSIS — Z23 Encounter for immunization: Secondary | ICD-10-CM | POA: Diagnosis not present

## 2018-11-07 DIAGNOSIS — E559 Vitamin D deficiency, unspecified: Secondary | ICD-10-CM | POA: Diagnosis not present

## 2018-11-07 DIAGNOSIS — Z79899 Other long term (current) drug therapy: Secondary | ICD-10-CM | POA: Diagnosis not present

## 2018-11-07 DIAGNOSIS — J301 Allergic rhinitis due to pollen: Secondary | ICD-10-CM | POA: Diagnosis not present

## 2018-11-07 DIAGNOSIS — R69 Illness, unspecified: Secondary | ICD-10-CM | POA: Diagnosis not present

## 2018-11-07 DIAGNOSIS — E7800 Pure hypercholesterolemia, unspecified: Secondary | ICD-10-CM | POA: Diagnosis not present

## 2018-11-07 DIAGNOSIS — Z0000 Encounter for general adult medical examination without abnormal findings: Secondary | ICD-10-CM | POA: Diagnosis not present

## 2018-11-07 DIAGNOSIS — I10 Essential (primary) hypertension: Secondary | ICD-10-CM | POA: Diagnosis not present

## 2019-01-10 MED FILL — VALSARTAN-HCTZ 160-12.5 MG: 160-12.5 | 30 days supply | Qty: 30 | Fill #1

## 2019-02-19 MED FILL — VALSARTAN-HCTZ 160-12.5 MG: 160-12.5 | 30 days supply | Qty: 30 | Fill #2

## 2019-02-19 MED FILL — SERTRALINE HCL 100 MG TAB: 100 | 90 days supply | Qty: 90 | Fill #1

## 2019-03-28 MED FILL — VALSARTAN-HCTZ 160-12.5 MG: 160-12.5 | 30 days supply | Qty: 30 | Fill #3

## 2019-05-03 MED FILL — VALSARTAN-HCTZ 160-12.5 MG: 160-12.5 | 30 days supply | Qty: 30 | Fill #4

## 2019-05-08 DIAGNOSIS — L812 Freckles: Secondary | ICD-10-CM | POA: Diagnosis not present

## 2019-05-08 DIAGNOSIS — M6740 Ganglion, unspecified site: Secondary | ICD-10-CM | POA: Diagnosis not present

## 2019-05-08 DIAGNOSIS — L570 Actinic keratosis: Secondary | ICD-10-CM | POA: Diagnosis not present

## 2019-06-03 MED FILL — VALSARTAN-HCTZ 160-12.5 MG: 160-12.5 | 30 days supply | Qty: 30 | Fill #5

## 2019-06-03 MED FILL — SERTRALINE HCL 100 MG TAB: 100 | 90 days supply | Qty: 90 | Fill #2

## 2019-06-20 DIAGNOSIS — Z23 Encounter for immunization: Secondary | ICD-10-CM | POA: Diagnosis not present

## 2019-07-08 MED FILL — VALSARTAN-HCTZ 160-12.5 MG: 160-12.5 | 30 days supply | Qty: 30 | Fill #6

## 2019-08-05 DIAGNOSIS — R69 Illness, unspecified: Secondary | ICD-10-CM | POA: Diagnosis not present

## 2019-08-06 MED FILL — VALSARTAN-HCTZ 160-12.5 MG: 160-12.5 | 30 days supply | Qty: 30 | Fill #0

## 2019-08-26 DIAGNOSIS — R69 Illness, unspecified: Secondary | ICD-10-CM | POA: Diagnosis not present

## 2019-09-10 MED FILL — SERTRALINE HCL 100 MG TAB: 100 | 90 days supply | Qty: 90 | Fill #0

## 2019-09-10 MED FILL — VALSARTAN-HCTZ 160-12.5 MG: 160-12.5 | 30 days supply | Qty: 30 | Fill #1

## 2019-09-18 MED FILL — PEG-3350 SOLUTION: 420 | 1 days supply | Qty: 4000 | Fill #0

## 2019-10-04 DIAGNOSIS — Z1159 Encounter for screening for other viral diseases: Secondary | ICD-10-CM | POA: Diagnosis not present

## 2019-10-09 DIAGNOSIS — Z86010 Personal history of colonic polyps: Secondary | ICD-10-CM | POA: Diagnosis not present

## 2019-10-09 DIAGNOSIS — D122 Benign neoplasm of ascending colon: Secondary | ICD-10-CM | POA: Diagnosis not present

## 2019-10-09 DIAGNOSIS — K649 Unspecified hemorrhoids: Secondary | ICD-10-CM | POA: Diagnosis not present

## 2019-10-09 DIAGNOSIS — D123 Benign neoplasm of transverse colon: Secondary | ICD-10-CM | POA: Diagnosis not present

## 2019-10-11 DIAGNOSIS — D123 Benign neoplasm of transverse colon: Secondary | ICD-10-CM | POA: Diagnosis not present

## 2019-10-11 DIAGNOSIS — D122 Benign neoplasm of ascending colon: Secondary | ICD-10-CM | POA: Diagnosis not present

## 2019-10-14 MED FILL — VALSARTAN-HCTZ 160-12.5 MG: 160-12.5 | 30 days supply | Qty: 30 | Fill #2

## 2019-11-10 ENCOUNTER — Ambulatory Visit: Payer: Medicare HMO | Attending: Internal Medicine

## 2019-11-10 DIAGNOSIS — Z23 Encounter for immunization: Secondary | ICD-10-CM | POA: Insufficient documentation

## 2019-11-10 NOTE — Progress Notes (Signed)
   Covid-19 Vaccination Clinic  Name:  Patrick Brooks    MRN: [id number] DOB: March 01, 1951  11/10/2019  Patrick Brooks was observed post Covid-19 immunization for 15 minutes without incidence. He was provided with Vaccine Information Sheet and instruction to access the V-Safe system.   Patrick Brooks was instructed to call 911 with any severe reactions post vaccine: [name] Difficulty breathing  . Swelling of your face and throat  . A fast heartbeat  . A bad rash all over your body  . Dizziness and weakness    Immunizations Administered    Name Date Dose VIS Date Route   Pfizer COVID-19 Vaccine 11/10/2019  4:23 PM 0.3 mL 08/23/2019 Intramuscular   Manufacturer: [place]   Lot: [id number]   [place]: [id number]

## 2019-11-13 MED FILL — VALSARTAN-HCTZ 160-12.5 MG: 160-12.5 | 30 days supply | Qty: 30 | Fill #3

## 2019-11-18 DIAGNOSIS — E559 Vitamin D deficiency, unspecified: Secondary | ICD-10-CM | POA: Diagnosis not present

## 2019-11-18 DIAGNOSIS — R69 Illness, unspecified: Secondary | ICD-10-CM | POA: Diagnosis not present

## 2019-11-18 DIAGNOSIS — J301 Allergic rhinitis due to pollen: Secondary | ICD-10-CM | POA: Diagnosis not present

## 2019-11-18 DIAGNOSIS — I10 Essential (primary) hypertension: Secondary | ICD-10-CM | POA: Diagnosis not present

## 2019-11-18 DIAGNOSIS — Z125 Encounter for screening for malignant neoplasm of prostate: Secondary | ICD-10-CM | POA: Diagnosis not present

## 2019-11-18 DIAGNOSIS — H9190 Unspecified hearing loss, unspecified ear: Secondary | ICD-10-CM | POA: Diagnosis not present

## 2019-11-18 DIAGNOSIS — Z79899 Other long term (current) drug therapy: Secondary | ICD-10-CM | POA: Diagnosis not present

## 2019-11-18 DIAGNOSIS — D126 Benign neoplasm of colon, unspecified: Secondary | ICD-10-CM | POA: Diagnosis not present

## 2019-11-18 DIAGNOSIS — E7800 Pure hypercholesterolemia, unspecified: Secondary | ICD-10-CM | POA: Diagnosis not present

## 2019-11-18 DIAGNOSIS — Z0001 Encounter for general adult medical examination with abnormal findings: Secondary | ICD-10-CM | POA: Diagnosis not present

## 2019-12-11 ENCOUNTER — Ambulatory Visit: Payer: Medicare HMO | Attending: Internal Medicine

## 2019-12-11 DIAGNOSIS — Z23 Encounter for immunization: Secondary | ICD-10-CM

## 2019-12-11 NOTE — Progress Notes (Signed)
   Covid-19 Vaccination Clinic  Name:  Patrick Brooks    MRN: [id number] DOB: 08/28/1951  12/11/2019  Mr. Adamczyk was observed post Covid-19 immunization for 15 minutes without incident. He was provided with Vaccine Information Sheet and instruction to access the V-Safe system.   Mr. Gendreau was instructed to call 911 with any severe reactions post vaccine: [name] Difficulty breathing  . Swelling of face and throat  . A fast heartbeat  . A bad rash all over body  . Dizziness and weakness   Immunizations Administered    Name Date Dose VIS Date Route   Pfizer COVID-19 Vaccine 12/11/2019  8:30 AM 0.3 mL 08/23/2019 Intramuscular   Manufacturer: [place]   Lot: [id number]   [place]: [id number]

## 2019-12-16 MED FILL — SERTRALINE HCL 100 MG TAB: 100 | 90 days supply | Qty: 90 | Fill #1

## 2019-12-16 MED FILL — VALSARTAN-HCTZ 160-12.5 MG: 160-12.5 | 30 days supply | Qty: 30 | Fill #4

## 2020-01-16 MED FILL — VALSARTAN-HCTZ 160-12.5 MG: 160-12.5 | 30 days supply | Qty: 30 | Fill #5

## 2020-01-29 DIAGNOSIS — R69 Illness, unspecified: Secondary | ICD-10-CM | POA: Diagnosis not present

## 2020-02-13 MED FILL — VALSARTAN-HCTZ 160-12.5 MG: 160-12.5 | 30 days supply | Qty: 30 | Fill #6

## 2020-02-24 DIAGNOSIS — R69 Illness, unspecified: Secondary | ICD-10-CM | POA: Diagnosis not present

## 2020-03-16 DIAGNOSIS — R69 Illness, unspecified: Secondary | ICD-10-CM | POA: Diagnosis not present

## 2020-03-16 MED FILL — SERTRALINE HCL 100 MG TABS: 100 | 90 days supply | Qty: 90 | Fill #2

## 2020-03-16 MED FILL — VALSARTAN-HCTZ 160-12.5 MG: 160-12.5 | 30 days supply | Qty: 30 | Fill #7

## 2020-04-20 MED FILL — VALSARTAN-HCTZ 160-12.5 MG: 160-12.5 | 30 days supply | Qty: 30 | Fill #8

## 2020-05-13 DIAGNOSIS — D1801 Hemangioma of skin and subcutaneous tissue: Secondary | ICD-10-CM | POA: Diagnosis not present

## 2020-05-13 DIAGNOSIS — L812 Freckles: Secondary | ICD-10-CM | POA: Diagnosis not present

## 2020-05-13 DIAGNOSIS — L720 Epidermal cyst: Secondary | ICD-10-CM | POA: Diagnosis not present

## 2020-05-13 DIAGNOSIS — L570 Actinic keratosis: Secondary | ICD-10-CM | POA: Diagnosis not present

## 2020-05-13 DIAGNOSIS — L821 Other seborrheic keratosis: Secondary | ICD-10-CM | POA: Diagnosis not present

## 2020-05-21 MED FILL — VALSARTAN-HCTZ 160-12.5 MG: 160-12.5 | 30 days supply | Qty: 30 | Fill #9

## 2020-06-17 MED FILL — VALSARTAN-HCTZ 160-12.5 MG: 160-12.5 | 30 days supply | Qty: 30 | Fill #10

## 2020-06-17 MED FILL — SERTRALINE HCL 100 MG TABS: 100 | 90 days supply | Qty: 90 | Fill #3

## 2020-07-16 MED FILL — VALSARTAN-HCTZ 160-12.5 MG: 160-12.5 | 30 days supply | Qty: 30 | Fill #11

## 2020-08-03 DIAGNOSIS — R69 Illness, unspecified: Secondary | ICD-10-CM | POA: Diagnosis not present

## 2020-08-24 ENCOUNTER — Other Ambulatory Visit (HOSPITAL_COMMUNITY): Payer: Self-pay | Admitting: Internal Medicine

## 2020-08-24 MED FILL — VALSARTAN-HCTZ 160-12.5 MG: 160-12.5 | 90 days supply | Qty: 90 | Fill #0

## 2020-08-25 ENCOUNTER — Other Ambulatory Visit (HOSPITAL_COMMUNITY): Payer: Self-pay | Admitting: Internal Medicine

## 2020-08-26 MED FILL — SERTRALINE HCL 100 MG TABS: 100 | 90 days supply | Qty: 90 | Fill #0

## 2020-09-11 MED FILL — SERTRALINE HCL 100 MG TABS: 100 | 90 days supply | Qty: 90 | Fill #0

## 2020-11-25 DIAGNOSIS — Z1211 Encounter for screening for malignant neoplasm of colon: Secondary | ICD-10-CM | POA: Diagnosis not present

## 2020-11-25 DIAGNOSIS — E785 Hyperlipidemia, unspecified: Secondary | ICD-10-CM | POA: Diagnosis not present

## 2020-11-25 DIAGNOSIS — I10 Essential (primary) hypertension: Secondary | ICD-10-CM | POA: Diagnosis not present

## 2020-11-25 DIAGNOSIS — E559 Vitamin D deficiency, unspecified: Secondary | ICD-10-CM | POA: Diagnosis not present

## 2020-11-25 DIAGNOSIS — Z79899 Other long term (current) drug therapy: Secondary | ICD-10-CM | POA: Diagnosis not present

## 2020-11-25 DIAGNOSIS — Z0000 Encounter for general adult medical examination without abnormal findings: Secondary | ICD-10-CM | POA: Diagnosis not present

## 2020-11-25 DIAGNOSIS — E7800 Pure hypercholesterolemia, unspecified: Secondary | ICD-10-CM | POA: Diagnosis not present

## 2020-11-25 DIAGNOSIS — D126 Benign neoplasm of colon, unspecified: Secondary | ICD-10-CM | POA: Diagnosis not present

## 2020-11-25 DIAGNOSIS — Z125 Encounter for screening for malignant neoplasm of prostate: Secondary | ICD-10-CM | POA: Diagnosis not present

## 2020-11-25 DIAGNOSIS — H9190 Unspecified hearing loss, unspecified ear: Secondary | ICD-10-CM | POA: Diagnosis not present

## 2020-11-25 DIAGNOSIS — R69 Illness, unspecified: Secondary | ICD-10-CM | POA: Diagnosis not present

## 2020-11-25 DIAGNOSIS — J301 Allergic rhinitis due to pollen: Secondary | ICD-10-CM | POA: Diagnosis not present

## 2020-11-30 MED FILL — VALSARTAN-HCTZ 160-12.5 MG: 160-12.5 | 90 days supply | Qty: 90 | Fill #1

## 2020-12-14 ENCOUNTER — Other Ambulatory Visit (HOSPITAL_COMMUNITY): Payer: Self-pay

## 2020-12-14 MED FILL — Sertraline HCl Tab 100 MG: ORAL | 90 days supply | Qty: 90 | Fill #0 | Status: AC

## 2020-12-22 ENCOUNTER — Other Ambulatory Visit (HOSPITAL_COMMUNITY): Payer: Self-pay

## 2020-12-22 MED ORDER — NEOMYCIN-POLYMYXIN-HC 3.5-10000-1 OT SUSP
OTIC | 1 refills | Status: AC
  Filled 2020-12-22: qty 10, 7d supply, fill #0

## 2020-12-24 ENCOUNTER — Other Ambulatory Visit (HOSPITAL_COMMUNITY): Payer: Self-pay

## 2021-01-08 ENCOUNTER — Other Ambulatory Visit (HOSPITAL_COMMUNITY): Payer: Self-pay

## 2021-03-02 ENCOUNTER — Other Ambulatory Visit (HOSPITAL_COMMUNITY): Payer: Self-pay

## 2021-03-02 MED FILL — Valsartan-Hydrochlorothiazide Tab 160-12.5 MG: ORAL | 90 days supply | Qty: 90 | Fill #0 | Status: AC

## 2021-03-02 MED FILL — Sertraline HCl Tab 100 MG: ORAL | 90 days supply | Qty: 90 | Fill #1 | Status: AC

## 2021-05-10 ENCOUNTER — Other Ambulatory Visit (HOSPITAL_COMMUNITY): Payer: Self-pay

## 2021-05-14 ENCOUNTER — Other Ambulatory Visit (HOSPITAL_COMMUNITY): Payer: Self-pay

## 2021-05-18 ENCOUNTER — Other Ambulatory Visit (HOSPITAL_COMMUNITY): Payer: Self-pay

## 2021-05-19 DIAGNOSIS — D1801 Hemangioma of skin and subcutaneous tissue: Secondary | ICD-10-CM | POA: Diagnosis not present

## 2021-05-19 DIAGNOSIS — D692 Other nonthrombocytopenic purpura: Secondary | ICD-10-CM | POA: Diagnosis not present

## 2021-05-19 DIAGNOSIS — L821 Other seborrheic keratosis: Secondary | ICD-10-CM | POA: Diagnosis not present

## 2021-05-19 DIAGNOSIS — L570 Actinic keratosis: Secondary | ICD-10-CM | POA: Diagnosis not present

## 2021-05-19 DIAGNOSIS — L812 Freckles: Secondary | ICD-10-CM | POA: Diagnosis not present

## 2021-05-25 ENCOUNTER — Other Ambulatory Visit (HOSPITAL_COMMUNITY): Payer: Self-pay

## 2021-05-28 DIAGNOSIS — H25813 Combined forms of age-related cataract, bilateral: Secondary | ICD-10-CM | POA: Diagnosis not present

## 2021-05-31 ENCOUNTER — Other Ambulatory Visit (HOSPITAL_COMMUNITY): Payer: Self-pay

## 2021-06-01 ENCOUNTER — Other Ambulatory Visit (HOSPITAL_COMMUNITY): Payer: Self-pay

## 2021-06-01 MED ORDER — VALSARTAN-HYDROCHLOROTHIAZIDE 160-12.5 MG PO TABS
ORAL_TABLET | ORAL | 3 refills | Status: DC
  Filled 2021-06-01: qty 90, 90d supply, fill #0

## 2021-06-02 ENCOUNTER — Other Ambulatory Visit (HOSPITAL_COMMUNITY): Payer: Self-pay

## 2021-07-02 ENCOUNTER — Other Ambulatory Visit (HOSPITAL_COMMUNITY): Payer: Self-pay

## 2021-07-02 MED FILL — Sertraline HCl Tab 100 MG: ORAL | 90 days supply | Qty: 90 | Fill #2 | Status: AC

## 2021-07-14 ENCOUNTER — Other Ambulatory Visit (HOSPITAL_COMMUNITY): Payer: Self-pay

## 2021-07-21 ENCOUNTER — Other Ambulatory Visit (HOSPITAL_COMMUNITY): Payer: Self-pay

## 2021-09-07 ENCOUNTER — Other Ambulatory Visit (HOSPITAL_COMMUNITY): Payer: Self-pay

## 2021-09-20 ENCOUNTER — Other Ambulatory Visit (HOSPITAL_COMMUNITY): Payer: Self-pay

## 2021-10-04 ENCOUNTER — Other Ambulatory Visit (HOSPITAL_COMMUNITY): Payer: Self-pay

## 2021-12-01 DIAGNOSIS — E559 Vitamin D deficiency, unspecified: Secondary | ICD-10-CM | POA: Diagnosis not present

## 2021-12-01 DIAGNOSIS — I10 Essential (primary) hypertension: Secondary | ICD-10-CM | POA: Diagnosis not present

## 2021-12-01 DIAGNOSIS — Z125 Encounter for screening for malignant neoplasm of prostate: Secondary | ICD-10-CM | POA: Diagnosis not present

## 2021-12-01 DIAGNOSIS — E7800 Pure hypercholesterolemia, unspecified: Secondary | ICD-10-CM | POA: Diagnosis not present

## 2021-12-01 DIAGNOSIS — Z600 Problems of adjustment to life-cycle transitions: Secondary | ICD-10-CM | POA: Diagnosis not present

## 2021-12-01 DIAGNOSIS — J301 Allergic rhinitis due to pollen: Secondary | ICD-10-CM | POA: Diagnosis not present

## 2021-12-01 DIAGNOSIS — H9190 Unspecified hearing loss, unspecified ear: Secondary | ICD-10-CM | POA: Diagnosis not present

## 2021-12-01 DIAGNOSIS — D126 Benign neoplasm of colon, unspecified: Secondary | ICD-10-CM | POA: Diagnosis not present

## 2021-12-01 DIAGNOSIS — Z79899 Other long term (current) drug therapy: Secondary | ICD-10-CM | POA: Diagnosis not present

## 2021-12-01 DIAGNOSIS — Z0001 Encounter for general adult medical examination with abnormal findings: Secondary | ICD-10-CM | POA: Diagnosis not present

## 2021-12-01 DIAGNOSIS — R69 Illness, unspecified: Secondary | ICD-10-CM | POA: Diagnosis not present

## 2022-04-26 DIAGNOSIS — L570 Actinic keratosis: Secondary | ICD-10-CM | POA: Diagnosis not present

## 2022-05-31 DIAGNOSIS — L821 Other seborrheic keratosis: Secondary | ICD-10-CM | POA: Diagnosis not present

## 2022-05-31 DIAGNOSIS — L570 Actinic keratosis: Secondary | ICD-10-CM | POA: Diagnosis not present

## 2022-05-31 DIAGNOSIS — D225 Melanocytic nevi of trunk: Secondary | ICD-10-CM | POA: Diagnosis not present

## 2022-05-31 DIAGNOSIS — D1801 Hemangioma of skin and subcutaneous tissue: Secondary | ICD-10-CM | POA: Diagnosis not present

## 2022-05-31 DIAGNOSIS — L812 Freckles: Secondary | ICD-10-CM | POA: Diagnosis not present

## 2022-06-01 DIAGNOSIS — H25813 Combined forms of age-related cataract, bilateral: Secondary | ICD-10-CM | POA: Diagnosis not present

## 2023-03-01 DIAGNOSIS — E7800 Pure hypercholesterolemia, unspecified: Secondary | ICD-10-CM | POA: Diagnosis not present

## 2023-03-01 DIAGNOSIS — Z0000 Encounter for general adult medical examination without abnormal findings: Secondary | ICD-10-CM | POA: Diagnosis not present

## 2023-03-01 DIAGNOSIS — Z125 Encounter for screening for malignant neoplasm of prostate: Secondary | ICD-10-CM | POA: Diagnosis not present

## 2023-03-01 DIAGNOSIS — Z79899 Other long term (current) drug therapy: Secondary | ICD-10-CM | POA: Diagnosis not present

## 2023-03-01 DIAGNOSIS — E559 Vitamin D deficiency, unspecified: Secondary | ICD-10-CM | POA: Diagnosis not present

## 2023-03-01 DIAGNOSIS — Z1211 Encounter for screening for malignant neoplasm of colon: Secondary | ICD-10-CM | POA: Diagnosis not present

## 2023-03-01 DIAGNOSIS — D126 Benign neoplasm of colon, unspecified: Secondary | ICD-10-CM | POA: Diagnosis not present

## 2023-03-01 DIAGNOSIS — I10 Essential (primary) hypertension: Secondary | ICD-10-CM | POA: Diagnosis not present

## 2023-03-01 DIAGNOSIS — Z600 Problems of adjustment to life-cycle transitions: Secondary | ICD-10-CM | POA: Diagnosis not present

## 2023-03-01 DIAGNOSIS — J301 Allergic rhinitis due to pollen: Secondary | ICD-10-CM | POA: Diagnosis not present

## 2023-05-18 DIAGNOSIS — Z09 Encounter for follow-up examination after completed treatment for conditions other than malignant neoplasm: Secondary | ICD-10-CM | POA: Diagnosis not present

## 2023-05-18 DIAGNOSIS — K5520 Angiodysplasia of colon without hemorrhage: Secondary | ICD-10-CM | POA: Diagnosis not present

## 2023-05-18 DIAGNOSIS — D122 Benign neoplasm of ascending colon: Secondary | ICD-10-CM | POA: Diagnosis not present

## 2023-05-18 DIAGNOSIS — Z86010 Personal history of colonic polyps: Secondary | ICD-10-CM | POA: Diagnosis not present

## 2023-05-18 DIAGNOSIS — K648 Other hemorrhoids: Secondary | ICD-10-CM | POA: Diagnosis not present

## 2023-05-22 DIAGNOSIS — D122 Benign neoplasm of ascending colon: Secondary | ICD-10-CM | POA: Diagnosis not present

## 2023-06-07 DIAGNOSIS — H25813 Combined forms of age-related cataract, bilateral: Secondary | ICD-10-CM | POA: Diagnosis not present

## 2023-06-28 DIAGNOSIS — L821 Other seborrheic keratosis: Secondary | ICD-10-CM | POA: Diagnosis not present

## 2023-06-28 DIAGNOSIS — L570 Actinic keratosis: Secondary | ICD-10-CM | POA: Diagnosis not present

## 2023-06-28 DIAGNOSIS — L812 Freckles: Secondary | ICD-10-CM | POA: Diagnosis not present

## 2023-06-28 DIAGNOSIS — D1801 Hemangioma of skin and subcutaneous tissue: Secondary | ICD-10-CM | POA: Diagnosis not present

## 2023-06-28 DIAGNOSIS — D225 Melanocytic nevi of trunk: Secondary | ICD-10-CM | POA: Diagnosis not present

## 2023-10-12 ENCOUNTER — Ambulatory Visit: Payer: Medicare HMO | Attending: Cardiology | Admitting: Cardiology

## 2023-10-12 ENCOUNTER — Encounter: Payer: Self-pay | Admitting: Cardiology

## 2023-10-12 VITALS — BP 142/74 | HR 84 | Ht 68.0 in | Wt 210.2 lb

## 2023-10-12 DIAGNOSIS — R0683 Snoring: Secondary | ICD-10-CM | POA: Diagnosis not present

## 2023-10-12 DIAGNOSIS — E785 Hyperlipidemia, unspecified: Secondary | ICD-10-CM | POA: Diagnosis not present

## 2023-10-12 DIAGNOSIS — I10 Essential (primary) hypertension: Secondary | ICD-10-CM | POA: Diagnosis not present

## 2023-10-12 DIAGNOSIS — R400 Somnolence: Secondary | ICD-10-CM

## 2023-10-12 NOTE — Progress Notes (Signed)
Cardiology Office Note:  .   Date:  10/12/2023  ID:  Patrick Brooks, DOB Oct 27, 1950, MRN [id number] PCP: Marden Noble, MD (Inactive)  [facility] Providers Cardiologist:  None     History of Present Illness: .   Patrick Brooks is a 73 y.o. male Discussed with the use of AI scribe   History of Present Illness   The patient is a 73 year old male with hypertension who presents with fatigue and excessive sleepiness. He is accompanied by his wife, Patrick Brooks.  He experiences significant fatigue and excessive sleepiness, taking one to two-hour naps during the day. His sleep pattern is irregular, often waking up after a few hours of sleep. 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 No chest pain, shortness of breath, or fainting. He takes B12 supplements to address low energy levels.  His wife, Patrick Brooks, has observed that he snores and has episodes where he stops breathing for 10 to 13 seconds during sleep, raising concerns about possible sleep apnea. He acknowledges snoring but jokes that his wife wouldn't know if she were asleep. He has not seen a cardiologist since before Dr. Patty Sermons returned.  He has a history of hypertension and is currently taking valsartan hydrochlorothiazide. He does not take aspirin or statins, having refused them previously.  He is a former Engineering geologist, now retired. He has never smoked, is not diabetic, and does not consume alcohol or drugs. He describes his diet as bland and low in fried foods, and he does not engage in regular exercise. In terms of family history, he is unsure about heart problems in his parents.           Studies Reviewed: [name]   EKG Interpretation Date/Time:  Thursday October 12 2023 11:37:12 EST Ventricular Rate:  82 PR Interval:  208 QRS Duration:  88 QT Interval:  378 QTC Calculation: 441 R  Axis:   7  Text Interpretation: Normal sinus rhythm Non-specific ST-t changes When compared with ECG of 26-Mar-2014 12:40, No significant change since last tracing Confirmed by Donato Schultz ([id number]) on 10/12/2023 11:44:47 AM    Results   LABS LDL: 110 (03/01/2023) Triglycerides: 299 (03/01/2023) Hemoglobin: 15.6 (03/01/2023) Potassium: 3.5 (03/01/2023) ALT: 35 (03/01/2023) TSH: 3.3 (03/01/2023)     Risk Assessment/Calculations:           Physical Exam:   VS:  BP (!) 142/74   Pulse 84   Ht 5\' 8"  (1.727 m)   Wt 210 lb 3.2 oz (95.3 kg)   SpO2 93%   BMI 31.96 kg/m    Wt Readings from Last 3 Encounters:  10/12/23 210 lb 3.2 oz (95.3 kg)  09/18/15 202 lb 12.8 oz (92 kg)  08/19/14 194 lb (88 kg)    GEN: Well nourished, well developed in no acute distress NECK: No JVD; No carotid bruits CARDIAC: RRR, no murmurs, no rubs, no gallops RESPIRATORY:  Clear to auscultation without rales, wheezing or rhonchi  ABDOMEN: Soft, non-tender, non-distended EXTREMITIES:  No edema; No deformity   ASSESSMENT AND PLAN: .    Assessment and Plan    Fatigue and Excessive Sleepiness 73 year old with significant fatigue and excessive daytime sleepiness. Reports frequent naps and disrupted sleep patterns. Differential diagnosis includes sleep apnea, given the history of snoring and observed apneic episodes lasting 10-13 seconds. Discussed the potential benefits of a sleep study,  including the identification and management of sleep apnea, which could improve energy levels and overall quality of life. Explained that the sleep study can be done at home using a portable monitor. - Order sleep study through Dr. Donnamarie Poag team at [facility] for evaluation of sleep apnea  Hypertension Long-standing hypertension managed with valsartan hydrochlorothiazide. No recent cardiovascular evaluations. Discussed the importance of a comprehensive cardiac workup to rule out underlying cardiac issues contributing to fatigue.  Explained coronary calcium score (CT scan) as a non-invasive method to detect calcified plaque in coronary arteries, which could influence the decision to start statin therapy. - Continue valsartan hydrochlorothiazide 160/12.5 mg - Refer to cardiologist for comprehensive cardiac workup - Order coronary calcium score (CT scan) to assess for calcified plaque  Hyperlipidemia Elevated triglycerides (299 mg/dL) noted on recent lab work. Previously declined statin therapy. Discussed potential benefits of statin therapy (rosuvastatin or Crestor) in reducing cardiovascular risk if coronary calcium score indicates calcified plaque. Emphasized the importance of diet and exercise in managing lipid levels. - Discuss potential benefits of statin therapy (rosuvastatin or Crestor) if coronary calcium score indicates calcified plaque - Recommend Mediterranean diet and regular exercise for lipid management  General Health Maintenance Discussed importance of diet and exercise. Recommended Mediterranean diet and 30 minutes of daily physical activity. Emphasized the role of lifestyle modifications in overall cardiovascular health. - Encourage adherence to Mediterranean diet - Advise 30 minutes of daily physical activity  Follow-up - Coordinate with Dr. Orson Aloe for sleep study and follow-up -Follow-up with results of coronary calcium score results.               Signed, Donato Schultz, MD

## 2023-10-12 NOTE — Patient Instructions (Signed)
Medication Instructions:  The current medical regimen is effective;  continue present plan and medications.  *If you need a refill on your cardiac medications before your next appointment, please call your pharmacy*   Testing/Procedures: Your physician has requested that you have a Coronary Calcium score which is completed by CT. Cardiac computed tomography (CT) is a painless test that uses an x-ray machine to take clear, detailed pictures of your heart. There are no instructions for this testing.  You may eat/drink and take your normal medications this day.  The cost of the testing is $99 due at the time of your appointment.  Please follow up with your primary care doctor to discuss sleep evaluation for apnea.  Follow-Up: At [facility], you and your health needs are our priority.  As part of our continuing mission to provide you with exceptional heart care, we have created designated Provider Care Teams.  These Care Teams include your primary Cardiologist (physician) and Advanced Practice Providers (APPs -  Physician Assistants and Nurse Practitioners) who all work together to provide you with the care you need, when you need it.  We recommend signing up for the patient portal called "MyChart".  Sign up information is provided on this After Visit Summary.  MyChart is used to connect with patients for Virtual Visits (Telemedicine).  Patients are able to view lab/test results, encounter notes, upcoming appointments, etc.  Non-urgent messages can be sent to your provider as well.   To learn more about what you can do with MyChart, go to [email or web address].    Your next appointment:   Follow up as needed with Dr Anne Fu

## 2023-10-26 ENCOUNTER — Ambulatory Visit (HOSPITAL_COMMUNITY)
Admission: RE | Admit: 2023-10-26 | Discharge: 2023-10-26 | Disposition: A | Discharge status: Home / Self Care | Payer: Self-pay | Source: Ambulatory Visit | Attending: Cardiology | Admitting: Cardiology

## 2023-10-26 DIAGNOSIS — R0683 Snoring: Secondary | ICD-10-CM | POA: Diagnosis not present

## 2023-10-26 DIAGNOSIS — I10 Essential (primary) hypertension: Secondary | ICD-10-CM | POA: Insufficient documentation

## 2023-10-26 DIAGNOSIS — E785 Hyperlipidemia, unspecified: Secondary | ICD-10-CM | POA: Insufficient documentation

## 2023-10-28 ENCOUNTER — Encounter: Payer: Self-pay | Admitting: Cardiology

## 2023-11-06 DIAGNOSIS — H25813 Combined forms of age-related cataract, bilateral: Secondary | ICD-10-CM | POA: Diagnosis not present

## 2023-11-15 DIAGNOSIS — G4733 Obstructive sleep apnea (adult) (pediatric): Secondary | ICD-10-CM | POA: Diagnosis not present

## 2023-11-20 DIAGNOSIS — G4733 Obstructive sleep apnea (adult) (pediatric): Secondary | ICD-10-CM | POA: Diagnosis not present

## 2023-11-23 DIAGNOSIS — H2511 Age-related nuclear cataract, right eye: Secondary | ICD-10-CM | POA: Diagnosis not present

## 2023-11-27 ENCOUNTER — Telehealth: Payer: Self-pay | Admitting: Cardiology

## 2023-11-27 NOTE — Telephone Encounter (Addendum)
 Lets go ahead and double the valsartan hydrochlorothiazide to 360-25 mg a day.  Donato Schultz, MD   Patient's wife Rosey Bath is reporting elevated BP readings for patient.  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 She states no change in salt intake, no missed doses of BP med, does not seem to be stress-related.  BP readings taken at least 2 hours after taking medication:  3/17--150/70 3/2--144/70 End of February---130/70 120/72 148/70 150/72 145/70  Rosey Bath states patient does not have any dizziness, chest pain, headache, or blurred vision.  Currently taking valsartan-hydrochlorothiazide 160-12.5 mg daily.  Rosey Bath is concerned about Patient's BP remaining elevated, she states BP medication was working well until recently (around February). Baseline BP around 120s/70s.  Will forward to Dr. Anne Fu to review and advise.

## 2023-11-27 NOTE — Telephone Encounter (Signed)
 Spoke with Rosey Bath and patient, discussed Dr. Anne Fu' recommendation:  Lets go ahead and double the valsartan hydrochlorothiazide to 360-25 mg a day.   Donato Schultz, MD      Patient will take 2 tablets of his current Rx and hold off on sending new Rx in for right now to see how Patient responds to increased dose.  Patient will check BP daily for the next week and will send a list of readings for Dr. Anne Fu to review response to increased dose.  Rosey Bath and patient verbalized understanding of the above and expressed appreciation for follow-up.

## 2023-11-27 NOTE — Telephone Encounter (Signed)
 Pt c/o BP issue: STAT if pt c/o blurred vision, one-sided weakness or slurred speech.  STAT if BP is GREATER than 180/120 TODAY.  STAT if BP is LESS than 90/60 and SYMPTOMATIC TODAY  1. What is your BP concern? BP has been increasing   2. Have you taken any BP medication today?yes  3. What are your last 5 BP readings?150/70, 144/70, 130/70, 120/72, 148/70, 150/72, 145/70  4. Are you having any other symptoms (ex. Dizziness, headache, blurred vision, passed out)? no

## 2023-12-07 DIAGNOSIS — H2512 Age-related nuclear cataract, left eye: Secondary | ICD-10-CM | POA: Diagnosis not present

## 2023-12-09 DIAGNOSIS — H2512 Age-related nuclear cataract, left eye: Secondary | ICD-10-CM | POA: Diagnosis not present

## 2023-12-11 ENCOUNTER — Other Ambulatory Visit: Payer: Self-pay

## 2023-12-11 NOTE — Telephone Encounter (Signed)
 Patient is wife is calling to give the patient's BP readings. Patient's wife stated she is a retired Engineer, civil (consulting) and took the BP readings manually from the left arm. Patient's wife stated the morning readings were before the patient took his Valsartan. While the evening/night readings were 6-8 hours after taking the Valsartan. Please advise.  03/18 Morning- 130/72 Evening- 150/72  03/19 Morning- 130/72 Night 115/60  03/20 - Morning- 120/72 Evening- 138/80  03/21 Morning only 130/68  03/22 Morning- 140/70 Evening-124/62  03/23 Morning- 130/70 Evening- 142/72  03/24 Morning- 120/60 Evening- 148/70  03/25 Morning only 130/70  03/26 Morning only- 128/70  03/27 Patient had cataract surgery on this day and BP was high from the anesthesia and nerves of surgery  03/28 Day after surgery, pt was still recovering  03/29 Morning- 120/64 Evening- 140/68

## 2023-12-11 NOTE — Telephone Encounter (Signed)
 Spoke with pt's wife and explained Dr. Anne Fu' response. Pt's wife verbalized understanding- states pt will need refill of Valsartan soon. Explained that since Dr. Anne Fu didn't originally prescribe this medication, I will verify with him first before sending in refill. Pt's wife verbalized understanding and had no further questions.

## 2023-12-12 MED ORDER — VALSARTAN-HYDROCHLOROTHIAZIDE 320-25 MG PO TABS: 1.0000 | ORAL_TABLET | Freq: Every day | ORAL | 3 refills | Status: AC

## 2023-12-12 NOTE — Telephone Encounter (Signed)
 OK to refill BID OK In future ask patient to have PCP to refill meds to keep under one house.  Thanks Donato Schultz, MD    RX for Valsartan/hydrochlorothiazide [phone] one tablet daily sent into pharmacy as ordered.

## 2023-12-12 NOTE — Addendum Note (Signed)
 Addended by: Sharin Grave on: 12/12/2023 11:46 AM   Modules accepted: Orders

## 2023-12-15 DIAGNOSIS — G4733 Obstructive sleep apnea (adult) (pediatric): Secondary | ICD-10-CM | POA: Diagnosis not present

## 2024-01-14 DIAGNOSIS — G4733 Obstructive sleep apnea (adult) (pediatric): Secondary | ICD-10-CM | POA: Diagnosis not present

## 2024-02-14 DIAGNOSIS — G4733 Obstructive sleep apnea (adult) (pediatric): Secondary | ICD-10-CM | POA: Diagnosis not present

## 2024-03-05 DIAGNOSIS — E7800 Pure hypercholesterolemia, unspecified: Secondary | ICD-10-CM | POA: Diagnosis not present

## 2024-03-05 DIAGNOSIS — Z0000 Encounter for general adult medical examination without abnormal findings: Secondary | ICD-10-CM | POA: Diagnosis not present

## 2024-03-05 DIAGNOSIS — Z1331 Encounter for screening for depression: Secondary | ICD-10-CM | POA: Diagnosis not present

## 2024-03-05 DIAGNOSIS — Z125 Encounter for screening for malignant neoplasm of prostate: Secondary | ICD-10-CM | POA: Diagnosis not present

## 2024-03-05 DIAGNOSIS — E559 Vitamin D deficiency, unspecified: Secondary | ICD-10-CM | POA: Diagnosis not present

## 2024-03-05 DIAGNOSIS — Z79899 Other long term (current) drug therapy: Secondary | ICD-10-CM | POA: Diagnosis not present

## 2024-03-15 DIAGNOSIS — G4733 Obstructive sleep apnea (adult) (pediatric): Secondary | ICD-10-CM | POA: Diagnosis not present

## 2024-04-01 DIAGNOSIS — G4733 Obstructive sleep apnea (adult) (pediatric): Secondary | ICD-10-CM | POA: Diagnosis not present

## 2024-04-01 DIAGNOSIS — G4734 Idiopathic sleep related nonobstructive alveolar hypoventilation: Secondary | ICD-10-CM | POA: Diagnosis not present

## 2024-04-15 DIAGNOSIS — G4733 Obstructive sleep apnea (adult) (pediatric): Secondary | ICD-10-CM | POA: Diagnosis not present

## 2024-05-16 DIAGNOSIS — G4733 Obstructive sleep apnea (adult) (pediatric): Secondary | ICD-10-CM | POA: Diagnosis not present

## 2024-06-03 DIAGNOSIS — G4733 Obstructive sleep apnea (adult) (pediatric): Secondary | ICD-10-CM | POA: Diagnosis not present

## 2024-07-04 DIAGNOSIS — R972 Elevated prostate specific antigen [PSA]: Secondary | ICD-10-CM | POA: Diagnosis not present

## 2024-07-04 DIAGNOSIS — R399 Unspecified symptoms and signs involving the genitourinary system: Secondary | ICD-10-CM | POA: Diagnosis not present

## 2024-07-09 DIAGNOSIS — D485 Neoplasm of uncertain behavior of skin: Secondary | ICD-10-CM | POA: Diagnosis not present

## 2024-07-09 DIAGNOSIS — L821 Other seborrheic keratosis: Secondary | ICD-10-CM | POA: Diagnosis not present

## 2024-07-09 DIAGNOSIS — C4442 Squamous cell carcinoma of skin of scalp and neck: Secondary | ICD-10-CM | POA: Diagnosis not present

## 2024-07-09 DIAGNOSIS — L812 Freckles: Secondary | ICD-10-CM | POA: Diagnosis not present

## 2024-07-09 DIAGNOSIS — M6740 Ganglion, unspecified site: Secondary | ICD-10-CM | POA: Diagnosis not present

## 2024-07-09 DIAGNOSIS — D1801 Hemangioma of skin and subcutaneous tissue: Secondary | ICD-10-CM | POA: Diagnosis not present

## 2024-07-09 DIAGNOSIS — L570 Actinic keratosis: Secondary | ICD-10-CM | POA: Diagnosis not present

## 2024-07-11 DIAGNOSIS — R3912 Poor urinary stream: Secondary | ICD-10-CM | POA: Diagnosis not present

## 2024-07-11 DIAGNOSIS — N400 Enlarged prostate without lower urinary tract symptoms: Secondary | ICD-10-CM | POA: Diagnosis not present

## 2024-07-11 DIAGNOSIS — R3914 Feeling of incomplete bladder emptying: Secondary | ICD-10-CM | POA: Diagnosis not present

## 2024-07-11 DIAGNOSIS — R399 Unspecified symptoms and signs involving the genitourinary system: Secondary | ICD-10-CM | POA: Diagnosis not present

## 2024-07-11 DIAGNOSIS — R972 Elevated prostate specific antigen [PSA]: Secondary | ICD-10-CM | POA: Diagnosis not present

## 2024-08-15 DIAGNOSIS — G4733 Obstructive sleep apnea (adult) (pediatric): Secondary | ICD-10-CM | POA: Diagnosis not present
